# Patient Record
Sex: Female | Born: 1960 | Race: Black or African American | Hispanic: No | Smoking: Current every day smoker
Health system: Southern US, Community
[De-identification: ages and names within clinical notes are randomized; demographics above are authoritative.]

---

## 2006-11-30 DIAGNOSIS — M159 Polyosteoarthritis, unspecified: Secondary | ICD-10-CM | POA: Insufficient documentation

## 2007-03-16 ENCOUNTER — Ambulatory Visit: Payer: Self-pay | Admitting: Cardiology

## 2011-07-22 DIAGNOSIS — I10 Essential (primary) hypertension: Secondary | ICD-10-CM | POA: Insufficient documentation

## 2012-05-31 DIAGNOSIS — E042 Nontoxic multinodular goiter: Secondary | ICD-10-CM | POA: Insufficient documentation

## 2015-06-02 DIAGNOSIS — H2510 Age-related nuclear cataract, unspecified eye: Secondary | ICD-10-CM | POA: Insufficient documentation

## 2015-06-02 DIAGNOSIS — H02409 Unspecified ptosis of unspecified eyelid: Secondary | ICD-10-CM | POA: Insufficient documentation

## 2015-06-02 DIAGNOSIS — H47392 Other disorders of optic disc, left eye: Secondary | ICD-10-CM | POA: Insufficient documentation

## 2015-06-02 DIAGNOSIS — H401190 Primary open-angle glaucoma, unspecified eye, stage unspecified: Secondary | ICD-10-CM | POA: Insufficient documentation

## 2015-08-28 DIAGNOSIS — F1120 Opioid dependence, uncomplicated: Secondary | ICD-10-CM | POA: Insufficient documentation

## 2015-11-01 DIAGNOSIS — F32A Depression, unspecified: Secondary | ICD-10-CM | POA: Insufficient documentation

## 2015-11-01 DIAGNOSIS — F329 Major depressive disorder, single episode, unspecified: Secondary | ICD-10-CM | POA: Insufficient documentation

## 2016-12-06 ENCOUNTER — Encounter: Payer: Self-pay | Admitting: Endocrinology

## 2016-12-06 ENCOUNTER — Ambulatory Visit: Payer: Medicare HMO | Admitting: Endocrinology

## 2016-12-06 VITALS — BP 152/96 | HR 69 | Ht 62.75 in | Wt 106.8 lb

## 2016-12-06 DIAGNOSIS — M797 Fibromyalgia: Secondary | ICD-10-CM | POA: Insufficient documentation

## 2016-12-06 DIAGNOSIS — E042 Nontoxic multinodular goiter: Secondary | ICD-10-CM

## 2016-12-06 DIAGNOSIS — R7989 Other specified abnormal findings of blood chemistry: Secondary | ICD-10-CM | POA: Diagnosis not present

## 2016-12-06 LAB — T4, FREE: Free T4: 0.94 ng/dL (ref 0.60–1.60)

## 2016-12-06 LAB — TSH: TSH: 0.43 u[IU]/mL (ref 0.35–4.50)

## 2016-12-06 LAB — T3, FREE: T3, Free: 3.5 pg/mL (ref 2.3–4.2)

## 2016-12-06 NOTE — Progress Notes (Signed)
Patient ID: Alison Bond, female   DOB: 08/07/1960, 56 y.o.   MRN: [id number]          Reason for Appointment: Goiter, new consultation    History of Present Illness:   The patient's thyroid enlargement was first discovered in 2014 Most likely this was diagnosed on a routine physical exam but details not available  She has had no complaints of difficulty with swallowing  Does not feel like she has any choking sensation in her neck or pressure  Review of outside records and labs indicate that she has had a low TSH periodically since 2009 with the lowest level of 0.15 in 2015 Free T3 levels and free T4 levels have been consistently normal  At some point, possibly 2015 her PCP apparently treated her with methimazole for about a year but at that point she does not think she was having any symptoms of palpitations or unusual weight loss and she did not feel subjectively better with this  No results found for: FREET4, TSH  She has had an ultrasound exam in  2014 which showed multiple small thyroid nodules and diffuse heterogenous echogenicity, largest nodule 1.1 cm on the left, mixed solid and cystic, well circumscribed Follow-up ultrasound done in 2017 apparently showed no change, this was done by a general surgeon who had been consulted for her goiter.  Patient is being for now because of persistent low TSH levels with the last levels as follows:  09/02/16: TSH 0.13 with free T4 0.94, free T3 was last checked in 01/2015 and was 3.6  Currently the patient does not complain of any weight loss.  She says she has lost weight this year because of her cancer and weight has leveled off She has no complaints of palpitations, shakiness, heat intolerance or sweating   Allergies as of 12/06/2016      Reactions   Aspirin    Patient has ulcers and hurts her stomach and has a tight chest      Medication List        Accurate as of 12/06/16  4:59 PM. Always use your most recent med  list.          atenolol 50 MG tablet Commonly known as:  TENORMIN TAKE 2 TABLETS BY MOUTH EVERY DAY IN THE MORNING   Biotin 1000 MCG tablet Take 5,000 mcg by mouth daily.   brimonidine 0.2 % ophthalmic solution Commonly known as:  ALPHAGAN Apply to eye.   busPIRone 15 MG tablet Commonly known as:  BUSPAR TAKE 0.5-1 TABS BY MOUTH THREE TIMES DAILY AS NEEDED FOR OTHER (ANXIETY)   CENTRUM ADULTS PO Take 1 tablet by mouth daily.   cetirizine 10 MG tablet Commonly known as:  ZYRTEC Take by mouth.   diclofenac sodium 1 % Gel Commonly known as:  VOLTAREN Apply topically 2 (two) times daily.   DULoxetine 60 MG capsule Commonly known as:  CYMBALTA Take by mouth.   fluticasone 50 MCG/ACT nasal spray Commonly known as:  FLONASE Place into the nose.   losartan 50 MG tablet Commonly known as:  COZAAR TAKE 1 TABLET BY MOUTH EVERY DAY IN THE MORNING   LUMIGAN 0.01 % Soln Generic drug:  bimatoprost 1 Drop In rt eye. Dr Alferd Pateeichmond   meloxicam 15 MG tablet Commonly known as:  MOBIC TAKE 1 TAB BY MOUTH EVERY DAY TAKE WITH FOOD (FOR JOINT PAIN)   omeprazole 40 MG capsule Commonly known as:  PRILOSEC TAKE 1 CAPSULE BY MOUTH EVERY  EVENING BEFORE MEAL   oxyCODONE 15 MG immediate release tablet Commonly known as:  ROXICODONE Take 15 mg by mouth 4 (four) times daily.   SAVELLA 100 MG Tabs tablet Generic drug:  Milnacipran HCl Take 100 mg by mouth 2 (two) times daily.   vitamin B-12 100 MCG tablet Commonly known as:  CYANOCOBALAMIN Take one daily.       Allergies:  Allergies  Allergen Reactions  . Aspirin     Patient has ulcers and hurts her stomach and has a tight chest    History reviewed. No pertinent past medical history.  History reviewed. No pertinent surgical history.  Family History  Problem Relation Age of Onset  . Diabetes Mother   . Cancer Mother   . Hypertension Mother   . Cancer Paternal Aunt   . Thyroid disease Neg Hx     Social History:   reports that she has been smoking.  she has never used smokeless tobacco. Her alcohol and drug histories are not on file.    Review of Systems  Constitutional: Positive for reduced appetite. Negative for weight loss.  HENT: Negative for hoarseness and trouble swallowing.   Respiratory: Negative for cough and shortness of breath.   Cardiovascular: Positive for palpitations.       Rare  Gastrointestinal: Positive for diarrhea. Negative for abdominal pain.  Endocrine: Positive for fatigue.  Musculoskeletal: Positive for joint pain and muscle aches.  Skin:       Thin hair  Neurological: Negative for weakness and tremors.  Psychiatric/Behavioral: Positive for insomnia.      Examination:   BP (!) 152/96   Pulse 69   Ht 5' 2.75" (1.594 m)   Wt 106 lb 12.8 oz (48.4 kg)   LMP 12/07/2010   SpO2 98%   BMI 19.07 kg/m    General Appearance: pleasant, thinly built, not anxious         Eyes: No abnormal prominence or eyelid swelling. No lid lag           Neck: The thyroid is enlarged diffusely and most visible in the isthmus. Right lobe is about twice normal, has multiple small firm nodules, left lobe is nearly the same with multiple small firm nodules The isthmus is fleshy and prominent without nodularity There is no lymphadenopathy in the neck .     Cardiovascular: Normal  heart sounds, no murmur Respiratory:  Lungs clear Abdomen shows no hepatosplenomegaly or other mass Neurological: REFLEXES:  diffusely diminished and difficult to elicit Skin: no rash, pigmentation or lesions         Assessment/Plan:  Multinodular goiter, small and long-standing with largest nodule about 1 cm, evaluated as recently as 2017 and previously also in 2014 without any changes Her thyroid is autonomous with low TSH levels for several years and more persistently since about 2015 No history of hyperthyroidism with consistently normal free T4 and T3 levels and she has no clinical signs or symptoms of  hyperthyroidism Her weight loss is unrelated and recently stable  Recommendations: We will check her thyroid levels including free T3 level again today If her free T4 and free T3 are very normal she does not need any further evaluation except periodic follow-up However if she has a high free T3 level will proceed with nuclear thyroid scan and possibly I-131 treatment, this was explained    [facility] LtdKUMAR,Alison Bond 12/06/2016  Addendum: All labs including TSH are normal, she will be followed up in one year  Office Visit on 12/06/2016  Component Date Value Ref Range Status  . T3, Free 12/06/2016 3.5  2.3 - 4.2 pg/mL Final  . Free T4 12/06/2016 0.94  0.60 - 1.60 ng/dL Final   Comment: Specimens from patients who are undergoing biotin therapy and /or ingesting biotin supplements may contain high levels of biotin.  The higher biotin concentration in these specimens interferes with this Free T4 assay.  Specimens that contain high levels  of biotin may cause false high results for this Free T4 assay.  Please interpret results in light of the total clinical presentation of the patient.    Marland Kitchen TSH 12/06/2016 0.43  0.35 - 4.50 uIU/mL Final

## 2017-12-05 NOTE — Progress Notes (Deleted)
Patient ID: Alison Bond, female   DOB: 02/04/1960, 57 y.o.   MRN: [id number]          Reason for Appointment: Goiter, new consultation    History of Present Illness:   The patient's thyroid enlargement was first discovered in 2014 Most likely this was diagnosed on a routine physical exam but details not available  She has had no complaints of difficulty with swallowing  Does not feel like she has any choking sensation in her neck or pressure  Review of outside records and labs indicate that she has had a low TSH periodically since 2009 with the lowest level of 0.15 in 2015 Free T3 levels and free T4 levels have been consistently normal  At some point, possibly 2015 her PCP apparently treated her with methimazole for about a year but at that point she does not think she was having any symptoms of palpitations or unusual weight loss and she did not feel subjectively better with this  Lab Results  Component Value Date   FREET4 0.94 12/06/2016   TSH 0.43 12/06/2016    She has had an ultrasound exam in  2014 which showed multiple small thyroid nodules and diffuse heterogenous echogenicity, largest nodule 1.1 cm on the left, mixed solid and cystic, well circumscribed Follow-up ultrasound done in 2017 apparently showed no change, this was done by a general surgeon who had been consulted for her goiter.  Patient is being for now because of persistent low TSH levels with the last levels as follows:  09/02/16: TSH 0.13 with free T4 0.94, free T3 was last checked in 01/2015 and was 3.6  Currently the patient does not complain of any weight loss.  She says she has lost weight this year because of her cancer and weight has leveled off She has no complaints of palpitations, shakiness, heat intolerance or sweating   Allergies as of 12/06/2017      Reactions   Aspirin    Patient has ulcers and hurts her stomach and has a tight chest      Medication List        Accurate as  of 12/05/17  8:59 PM. Always use your most recent med list.          atenolol 50 MG tablet Commonly known as:  TENORMIN TAKE 2 TABLETS BY MOUTH EVERY DAY IN THE MORNING   Biotin 1000 MCG tablet Take 5,000 mcg by mouth daily.   brimonidine 0.2 % ophthalmic solution Commonly known as:  ALPHAGAN Apply to eye.   busPIRone 15 MG tablet Commonly known as:  BUSPAR TAKE 0.5-1 TABS BY MOUTH THREE TIMES DAILY AS NEEDED FOR OTHER (ANXIETY)   CENTRUM ADULTS PO Take 1 tablet by mouth daily.   cetirizine 10 MG tablet Commonly known as:  ZYRTEC Take by mouth.   diclofenac sodium 1 % Gel Commonly known as:  VOLTAREN Apply topically 2 (two) times daily.   DULoxetine 60 MG capsule Commonly known as:  CYMBALTA Take by mouth.   fluticasone 50 MCG/ACT nasal spray Commonly known as:  FLONASE Place into the nose.   losartan 50 MG tablet Commonly known as:  COZAAR TAKE 1 TABLET BY MOUTH EVERY DAY IN THE MORNING   LUMIGAN 0.01 % Soln Generic drug:  bimatoprost 1 Drop In rt eye. Dr Alferd Pateeichmond   meloxicam 15 MG tablet Commonly known as:  MOBIC TAKE 1 TAB BY MOUTH EVERY DAY TAKE WITH FOOD (FOR JOINT PAIN)   omeprazole 40 MG  capsule Commonly known as:  PRILOSEC TAKE 1 CAPSULE BY MOUTH EVERY EVENING BEFORE MEAL   oxyCODONE 15 MG immediate release tablet Commonly known as:  ROXICODONE Take 15 mg by mouth 4 (four) times daily.   SAVELLA 100 MG Tabs tablet Generic drug:  Milnacipran HCl Take 100 mg by mouth 2 (two) times daily.   vitamin B-12 100 MCG tablet Commonly known as:  CYANOCOBALAMIN Take one daily.       Allergies:  Allergies  Allergen Reactions  . Aspirin     Patient has ulcers and hurts her stomach and has a tight chest    No past medical history on file.  No past surgical history on file.  Family History  Problem Relation Age of Onset  . Diabetes Mother   . Cancer Mother   . Hypertension Mother   . Cancer Paternal Aunt   . Thyroid disease Neg Hx      Social History:  reports that she has been smoking. She has never used smokeless tobacco. Her alcohol and drug histories are not on file.    Review of Systems    Examination:   There were no vitals taken for this visit.   General Appearance: pleasant, thinly built, not anxious         Eyes: No abnormal prominence or eyelid swelling. No lid lag           Neck: The thyroid is enlarged diffusely and most visible in the isthmus. Right lobe is about twice normal, has multiple small firm nodules, left lobe is nearly the same with multiple small firm nodules The isthmus is fleshy and prominent without nodularity There is no lymphadenopathy in the neck .     Cardiovascular: Normal  heart sounds, no murmur Respiratory:  Lungs clear Abdomen shows no hepatosplenomegaly or other mass Neurological: REFLEXES:  diffusely diminished and difficult to elicit Skin: no rash, pigmentation or lesions         Assessment/Plan:  Multinodular goiter, small and long-standing with largest nodule about 1 cm, evaluated as recently as 2017 and previously also in 2014 without any changes Her thyroid is autonomous with low TSH levels for several years and more persistently since about 2015 No history of hyperthyroidism with consistently normal free T4 and T3 levels and she has no clinical signs or symptoms of hyperthyroidism Her weight loss is unrelated and recently stable  Recommendations: We will check her thyroid levels including free T3 level again today If her free T4 and free T3 are very normal she does not need any further evaluation except periodic follow-up However if she has a high free T3 level will proceed with nuclear thyroid scan and possibly I-131 treatment, this was explained    Reather Littler 12/05/2017  Addendum: All labs including TSH are normal, she will be followed up in one year  No visits with results within 1 Week(s) from this visit.  Latest known visit with results is:  Office  Visit on 12/06/2016  Component Date Value Ref Range Status  . T3, Free 12/06/2016 3.5  2.3 - 4.2 pg/mL Final  . Free T4 12/06/2016 0.94  0.60 - 1.60 ng/dL Final   Comment: Specimens from patients who are undergoing biotin therapy and /or ingesting biotin supplements may contain high levels of biotin.  The higher biotin concentration in these specimens interferes with this Free T4 assay.  Specimens that contain high levels  of biotin may cause false high results for this Free T4 assay.  Please  interpret results in light of the total clinical presentation of the patient.    Marland Kitchen TSH 12/06/2016 0.43  0.35 - 4.50 uIU/mL Final

## 2017-12-06 ENCOUNTER — Ambulatory Visit: Payer: Medicare HMO | Admitting: Endocrinology

## 2017-12-06 DIAGNOSIS — Z0289 Encounter for other administrative examinations: Secondary | ICD-10-CM

## 2018-11-05 DIAGNOSIS — Z902 Acquired absence of lung [part of]: Secondary | ICD-10-CM | POA: Insufficient documentation

## 2019-02-15 DIAGNOSIS — I739 Peripheral vascular disease, unspecified: Secondary | ICD-10-CM | POA: Insufficient documentation

## 2019-06-10 NOTE — Progress Notes (Signed)
Patient ID: Alison Bond, female   DOB: 01-02-1961, 59 y.o.   MRN: [id number]          Reason for Appointment: Goiter, follow-up consultation    History of Present Illness:   The patient's thyroid enlargement was first discovered in 2014 Most likely this was diagnosed on a routine physical exam but details not available Review of outside records and labs indicate that she had a low TSH periodically since 2009 with the lowest level of 0.15 in 2015 Free T3 levels and free T4 levels have been consistently normal However in 2015 her PCP apparently treated her with methimazole for about a year but at that point she does not think she was having any symptoms of palpitations or unusual weight loss and she did not feel subjectively better with this  RECENT HISTORY: 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 without any need for treatment she was told to follow-up in 1 year but she is now coming back finally for follow-up  Currently the patient complains of increasing fatigue for the last 2 to 3 years and especially in the last month Not having any recent weight loss although previously had lost weight with her cancer history Also has no symptoms of palpitations, shakiness, heat intolerance or sweating  She has had no complaints of difficulty with swallowing  Does not feel like she has any choking sensation in her neck or pressure  Last TSH done in 10/2018 was 0.44 Free thyroxine index normal at 1.04 but no free T3 available since 2017 when it was normal  Lab Results  Component Value Date   FREET4 0.94 12/06/2016   TSH 0.43 12/06/2016    She has had an ultrasound exam in  2014 which showed multiple small thyroid nodules and diffuse heterogenous echogenicity, largest nodule 1.1 cm on the left, mixed solid and cystic, well circumscribed Follow-up ultrasound done in 2017 apparently showed no change, this was done by a general surgeon who had been consulted for her  goiter.     Allergies as of 06/11/2019      Reactions   Aspirin    Patient has ulcers and hurts her stomach and has a tight chest      Medication List       Accurate as of Jun 11, 2019  2:08 PM. If you have any questions, ask your nurse or doctor.        STOP taking these medications   cetirizine 10 MG tablet Commonly known as: ZYRTEC Stopped by: Elayne Snare, MD   Savella 100 MG Tabs tablet Generic drug: Milnacipran HCl Stopped by: Elayne Snare, MD     TAKE these medications   atenolol 50 MG tablet Commonly known as: TENORMIN TAKE 2 TABLETS BY MOUTH EVERY DAY IN THE MORNING   Biotin 1000 MCG tablet Take 5,000 mcg by mouth daily.   brimonidine 0.2 % ophthalmic solution Commonly known as: ALPHAGAN Apply to eye.   busPIRone 15 MG tablet Commonly known as: BUSPAR TAKE 0.5-1 TABS BY MOUTH THREE TIMES DAILY AS NEEDED FOR OTHER (ANXIETY)   CENTRUM ADULTS PO Take 1 tablet by mouth daily.   diclofenac sodium 1 % Gel Commonly known as: VOLTAREN Apply topically 2 (two) times daily.   DULoxetine 60 MG capsule Commonly known as: CYMBALTA Take by mouth.   fluticasone 50 MCG/ACT nasal spray Commonly known as: FLONASE Place into the nose.   losartan 50 MG tablet Commonly known as: COZAAR TAKE 1  TABLET BY MOUTH EVERY DAY IN THE MORNING   Lumigan 0.01 % Soln Generic drug: bimatoprost 1 Drop In rt eye. Dr Alferd Patee   meloxicam 15 MG tablet Commonly known as: MOBIC TAKE 1 TAB BY MOUTH EVERY DAY TAKE WITH FOOD (FOR JOINT PAIN)   mirtazapine 7.5 MG tablet Commonly known as: REMERON Take 7.5 mg by mouth at bedtime.   omeprazole 40 MG capsule Commonly known as: PRILOSEC TAKE 1 CAPSULE BY MOUTH EVERY EVENING BEFORE MEAL   oxyCODONE 15 MG immediate release tablet Commonly known as: ROXICODONE Take 15 mg by mouth 4 (four) times daily.   vitamin B-12 100 MCG tablet Commonly known as: CYANOCOBALAMIN Take one daily.       Allergies:  Allergies  Allergen  Reactions  . Aspirin     Patient has ulcers and hurts her stomach and has a tight chest    History reviewed. No pertinent past medical history.  History reviewed. No pertinent surgical history.  Family History  Problem Relation Age of Onset  . Diabetes Mother   . Cancer Mother   . Hypertension Mother   . Cancer Paternal Aunt   . Thyroid disease Neg Hx     Social History:  reports that she has been smoking. She has never used smokeless tobacco. No history on file for alcohol and drug.    Review of Systems  Constitutional: Positive for reduced appetite. Negative for weight loss and weight gain.  HENT: Negative for hoarseness and trouble swallowing.   Respiratory: Negative for shortness of breath.   Cardiovascular: Positive for palpitations. Negative for leg swelling.  Gastrointestinal:       Genella Rife  Endocrine: Positive for fatigue.       For 2-3 years, worse now  Musculoskeletal: Positive for muscle aches.  Neurological: Positive for weakness.  Psychiatric/Behavioral: Positive for depressed mood, nervousness and insomnia.      Examination:   BP 120/62 (BP Location: Left Arm, Patient Position: Sitting, Cuff Size: Normal)   Pulse 66   Ht 5' 2.75" (1.594 m)   Wt 106 lb 12.8 oz (48.4 kg)   SpO2 99%   BMI 19.07 kg/m   She is mildly asthenic looking, not anxious, pleasant and cooperative         Eyes: No prominence or eyelid swelling  Thyroid exam: Right lobe is about 2-2-1/2 times normal, slightly firm with multiple small firm nodules, left lobe slightly smaller with multiple small nodules also The isthmus more uniform with a nodule on the right lateral area There is no lymphadenopathy in the neck .     Cardiovascular: Normal  heart sounds, no murmur Respiratory:  Lungs clear Abdomen shows no hepatosplenomegaly or other mass  Neurological: REFLEXES show normal contraction and relaxation  No ankle edema or skin rash        Assessment/Plan:  Multinodular  goiter, small and long-standing with largest nodule 1.1 cm, evaluated last in 2017 and previously also in 2014 with stable findings  She has autonomous function of the thyroid with low TSH levels for several years and more persistently since about 2015  Currently has no clinical signs or symptoms of hyperthyroidism Pulses relatively slow because of beta-blocker used for hypertension  Recommendations: We will check her thyroid levels including free T3 level again today If her free T4 and free T3 are normal she does not need any further evaluation again  However if she has a high free T3 level will proceed with nuclear thyroid scan and possibly I-131  treatment  Also since her thyroid exam today is quite similar to her previous findings in 2018 do not think repeat ultrasound is necessary; no new nodules felt  FATIGUE: This is unlikely to be from thyroid dysfunction and she will discuss with PCP, having more issues with insomnia and recent stress    Reather Littler 06/11/2019  Addendum Labs as follows: Since she has high T3 level will need to do thyroid scan and uptake to consider I-131 treatment   Office Visit on 06/11/2019  Component Date Value Ref Range Status  . T3, Free 06/11/2019 4.3* 2.3 - 4.2 pg/mL Final  . Free T4 06/11/2019 1.33  0.60 - 1.60 ng/dL Final   Comment: Specimens from patients who are undergoing biotin therapy and /or ingesting biotin supplements may contain high levels of biotin.  The higher biotin concentration in these specimens interferes with this Free T4 assay.  Specimens that contain high levels  of biotin may cause false high results for this Free T4 assay.  Please interpret results in light of the total clinical presentation of the patient.    Marland Kitchen TSH 06/11/2019 0.22* 0.35 - 4.50 uIU/mL Final

## 2019-06-11 ENCOUNTER — Ambulatory Visit (INDEPENDENT_AMBULATORY_CARE_PROVIDER_SITE_OTHER): Payer: Medicare HMO | Admitting: Endocrinology

## 2019-06-11 ENCOUNTER — Other Ambulatory Visit: Payer: Self-pay

## 2019-06-11 ENCOUNTER — Encounter: Payer: Self-pay | Admitting: Endocrinology

## 2019-06-11 VITALS — BP 120/62 | HR 66 | Ht 62.75 in | Wt 106.8 lb

## 2019-06-11 DIAGNOSIS — R5382 Chronic fatigue, unspecified: Secondary | ICD-10-CM | POA: Diagnosis not present

## 2019-06-11 DIAGNOSIS — E0520 Thyrotoxicosis with toxic multinodular goiter without thyrotoxic crisis or storm: Secondary | ICD-10-CM | POA: Diagnosis not present

## 2019-06-11 DIAGNOSIS — E042 Nontoxic multinodular goiter: Secondary | ICD-10-CM

## 2019-06-11 DIAGNOSIS — E0590 Thyrotoxicosis, unspecified without thyrotoxic crisis or storm: Secondary | ICD-10-CM | POA: Diagnosis not present

## 2019-06-11 LAB — T3, FREE: T3, Free: 4.3 pg/mL — ABNORMAL HIGH (ref 2.3–4.2)

## 2019-06-11 LAB — T4, FREE: Free T4: 1.33 ng/dL (ref 0.60–1.60)

## 2019-06-11 LAB — TSH: TSH: 0.22 u[IU]/mL — ABNORMAL LOW (ref 0.35–4.50)

## 2019-06-12 NOTE — Progress Notes (Signed)
Her labs show slight overactivity of the thyroid.  Need to do nuclear thyroid scan and if she has overactive areas in the thyroid will need treatment with radioactive iodine.  Can discuss with her further if needed on telephone.

## 2019-06-25 ENCOUNTER — Other Ambulatory Visit: Payer: Self-pay

## 2019-06-25 ENCOUNTER — Encounter (HOSPITAL_COMMUNITY)
Admission: RE | Admit: 2019-06-25 | Discharge: 2019-06-25 | Disposition: A | Discharge status: Home / Self Care | Payer: Medicare HMO | Source: Ambulatory Visit | Attending: Endocrinology | Admitting: Endocrinology

## 2019-06-25 ENCOUNTER — Encounter (HOSPITAL_BASED_OUTPATIENT_CLINIC_OR_DEPARTMENT_OTHER)
Admission: RE | Admit: 2019-06-25 | Discharge: 2019-06-25 | Disposition: A | Discharge status: Home / Self Care | Payer: Medicare HMO | Source: Ambulatory Visit | Attending: Endocrinology | Admitting: Endocrinology

## 2019-06-25 DIAGNOSIS — E0590 Thyrotoxicosis, unspecified without thyrotoxic crisis or storm: Secondary | ICD-10-CM | POA: Insufficient documentation

## 2019-06-25 MED ORDER — SODIUM IODIDE I-123 7.4 MBQ CAPS
442.0000 | ORAL_CAPSULE | Freq: Once | ORAL | Status: AC
  Administered 2019-06-25: 442 via ORAL

## 2019-06-26 ENCOUNTER — Encounter (HOSPITAL_COMMUNITY)
Admission: RE | Admit: 2019-06-26 | Discharge: 2019-06-26 | Disposition: A | Discharge status: Home / Self Care | Payer: Medicare HMO | Source: Ambulatory Visit | Attending: Endocrinology | Admitting: Endocrinology

## 2019-07-01 ENCOUNTER — Other Ambulatory Visit: Payer: Self-pay

## 2019-07-01 MED ORDER — PROPYLTHIOURACIL 50 MG PO TABS: 50.0000 mg | ORAL_TABLET | Freq: Two times a day (BID) | ORAL | 1 refills | Status: DC

## 2019-07-02 ENCOUNTER — Other Ambulatory Visit: Payer: Self-pay

## 2019-07-02 DIAGNOSIS — E0590 Thyrotoxicosis, unspecified without thyrotoxic crisis or storm: Secondary | ICD-10-CM

## 2019-07-02 DIAGNOSIS — E0520 Thyrotoxicosis with toxic multinodular goiter without thyrotoxic crisis or storm: Secondary | ICD-10-CM

## 2019-07-02 NOTE — Addendum Note (Signed)
Addended by: Adline Mango I on: 07/02/2019 09:20 AM   Modules accepted: Orders

## 2019-07-10 ENCOUNTER — Telehealth: Payer: Self-pay

## 2019-07-10 NOTE — Telephone Encounter (Signed)
FAXED DOCUMENTS  Company: Dr[facility] Ver[facility] office (faxed on 07/04/19)  Document: Orders for pt to have labs at that office Other records requested: none at this time.  All above requested information has been faxed successfully to [employer] listed above. Documents and fax confirmation have been placed in the faxed file for future reference.

## 2019-07-23 LAB — T3, FREE: T3, Free: 3.3 pg/mL (ref 2.3–4.2)

## 2019-07-23 LAB — T4: T4, Total: 7.5 ug/dL (ref 5.1–11.9)

## 2019-07-23 LAB — TSH: TSH: 0.43 mIU/L (ref 0.40–4.50)

## 2019-07-25 ENCOUNTER — Telehealth: Payer: Self-pay | Admitting: Endocrinology

## 2019-07-25 NOTE — Telephone Encounter (Signed)
Patient called stating since she started taking propylthiouracil she has been experiencing symptoms of exhaustion, loss of appetite, sweats, hands and legs hurting, and feet hurt when she wakes up in the morning, she is wondering if this medication is a cause of these symptoms. Please advise, ph# [phone]

## 2019-07-25 NOTE — Telephone Encounter (Signed)
Please advise 

## 2019-07-25 NOTE — Telephone Encounter (Signed)
Very unlikely that the low-dose of medication will cause the symptoms.  She needs to see her PCP first to evaluate the symptoms

## 2019-07-26 NOTE — Telephone Encounter (Signed)
Called pt and gave her MD message. Pt verbalized understanding. 

## 2019-08-11 NOTE — Progress Notes (Deleted)
Patient ID: Alison Bond, female   DOB: 1960/09/15, 59 y.o.   MRN: [id number]          Reason for Appointment: Goiter, follow-up consultation    History of Present Illness:   The patient's thyroid enlargement was first discovered in 2014 Most likely this was diagnosed on a routine physical exam but details not available Review of outside records and labs indicate that she had a low TSH periodically since 2009 with the lowest level of 0.15 in 2015 Free T3 levels and free T4 levels have been consistently normal However in 2015 her PCP apparently treated her with methimazole for about a year but at that point she does not think she was having any symptoms of palpitations or unusual weight loss and she did not feel subjectively better with this  RECENT HISTORY: 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 without any need for treatment she was told to follow-up in 1 year but she is now coming back finally for follow-up  Currently the patient complains of increasing fatigue for the last 2 to 3 years and especially in the last month Not having any recent weight loss although previously had lost weight with her cancer history Also has no symptoms of palpitations, shakiness, heat intolerance or sweating  She has had no complaints of difficulty with swallowing  Does not feel like she has any choking sensation in her neck or pressure  Last TSH done in 10/2018 was 0.44 Free thyroxine index normal at 1.04 but no free T3 available since 2017 when it was normal  Lab Results  Component Value Date   FREET4 1.33 06/11/2019   FREET4 0.94 12/06/2016   TSH 0.43 07/22/2019   TSH 0.22 (L) 06/11/2019   TSH 0.43 12/06/2016    She has had an ultrasound exam in  2014 which showed multiple small thyroid nodules and diffuse heterogenous echogenicity, largest nodule 1.1 cm on the left, mixed solid and cystic, well circumscribed Follow-up ultrasound done in 2017 apparently showed no  change, this was done by a general surgeon who had been consulted for her goiter.     Allergies as of 08/12/2019      Reactions   Aspirin    Patient has ulcers and hurts her stomach and has a tight chest      Medication List       Accurate as of August 11, 2019  9:00 PM. If you have any questions, ask your nurse or doctor.        atenolol 50 MG tablet Commonly known as: TENORMIN TAKE 2 TABLETS BY MOUTH EVERY DAY IN THE MORNING   Biotin 1000 MCG tablet Take 5,000 mcg by mouth daily.   brimonidine 0.2 % ophthalmic solution Commonly known as: ALPHAGAN Apply to eye.   busPIRone 15 MG tablet Commonly known as: BUSPAR TAKE 0.5-1 TABS BY MOUTH THREE TIMES DAILY AS NEEDED FOR OTHER (ANXIETY)   CENTRUM ADULTS PO Take 1 tablet by mouth daily.   diclofenac sodium 1 % Gel Commonly known as: VOLTAREN Apply topically 2 (two) times daily.   DULoxetine 60 MG capsule Commonly known as: CYMBALTA Take by mouth.   fluticasone 50 MCG/ACT nasal spray Commonly known as: FLONASE Place into the nose.   losartan 50 MG tablet Commonly known as: COZAAR TAKE 1 TABLET BY MOUTH EVERY DAY IN THE MORNING   Lumigan 0.01 % Soln Generic drug: bimatoprost 1 Drop In rt eye. Dr Alferd Patee   meloxicam 15  MG tablet Commonly known as: MOBIC TAKE 1 TAB BY MOUTH EVERY DAY TAKE WITH FOOD (FOR JOINT PAIN)   mirtazapine 7.5 MG tablet Commonly known as: REMERON Take 7.5 mg by mouth at bedtime.   omeprazole 40 MG capsule Commonly known as: PRILOSEC TAKE 1 CAPSULE BY MOUTH EVERY EVENING BEFORE MEAL   oxyCODONE 15 MG immediate release tablet Commonly known as: ROXICODONE Take 15 mg by mouth 4 (four) times daily.   propylthiouracil 50 MG tablet Commonly known as: PTU Take 1 tablet (50 mg total) by mouth 2 (two) times daily.   vitamin B-12 100 MCG tablet Commonly known as: CYANOCOBALAMIN Take one daily.       Allergies:  Allergies  Allergen Reactions  . Aspirin     Patient has ulcers and  hurts her stomach and has a tight chest    No past medical history on file.  No past surgical history on file.  Family History  Problem Relation Age of Onset  . Diabetes Mother   . Cancer Mother   . Hypertension Mother   . Cancer Paternal Aunt   . Thyroid disease Neg Hx     Social History:  reports that she has been smoking. She has never used smokeless tobacco. No history on file for alcohol use and drug use.    Review of Systems    Examination:   There were no vitals taken for this visit.  She is mildly asthenic looking, not anxious, pleasant and cooperative         Eyes: No prominence or eyelid swelling  Thyroid exam: Right lobe is about 2-2-1/2 times normal, slightly firm with multiple small firm nodules, left lobe slightly smaller with multiple small nodules also The isthmus more uniform with a nodule on the right lateral area There is no lymphadenopathy in the neck .     Cardiovascular: Normal  heart sounds, no murmur Respiratory:  Lungs clear Abdomen shows no hepatosplenomegaly or other mass  Neurological: REFLEXES show normal contraction and relaxation  No ankle edema or skin rash        Assessment/Plan:  Multinodular goiter, small and long-standing with largest nodule 1.1 cm, evaluated last in 2017 and previously also in 2014 with stable findings  She has autonomous function of the thyroid with low TSH levels for several years and more persistently since about 2015  Currently has no clinical signs or symptoms of hyperthyroidism Pulses relatively slow because of beta-blocker used for hypertension  Recommendations: We will check her thyroid levels including free T3 level again today If her free T4 and free T3 are normal she does not need any further evaluation again  However if she has a high free T3 level will proceed with nuclear thyroid scan and possibly I-131 treatment  Also since her thyroid exam today is quite similar to her previous findings in  2018 do not think repeat ultrasound is necessary; no new nodules felt  FATIGUE: This is unlikely to be from thyroid dysfunction and she will discuss with PCP, having more issues with insomnia and recent stress    Reather Littler 08/11/2019  Addendum Labs as follows: Since she has high T3 level will need to do thyroid scan and uptake to consider I-131 treatment   No visits with results within 1 Week(s) from this visit.  Latest known visit with results is:  Orders Only on 07/02/2019  Component Date Value Ref Range Status  . T3, Free 07/22/2019 3.3  2.3 - 4.2 pg/mL Final  .  T4, Total 07/22/2019 7.5  5.1 - 11.9 mcg/dL Final  . TSH [birth year]1950 0.43  0.40 - 4.50 mIU/L Final

## 2019-08-12 ENCOUNTER — Ambulatory Visit: Payer: Medicare HMO | Admitting: Endocrinology

## 2019-08-26 ENCOUNTER — Other Ambulatory Visit: Payer: Self-pay

## 2019-08-26 ENCOUNTER — Ambulatory Visit (INDEPENDENT_AMBULATORY_CARE_PROVIDER_SITE_OTHER): Payer: Medicare HMO | Admitting: Endocrinology

## 2019-08-26 ENCOUNTER — Encounter: Payer: Self-pay | Admitting: Endocrinology

## 2019-08-26 VITALS — BP 120/68 | HR 63 | Ht 62.75 in | Wt 103.2 lb

## 2019-08-26 DIAGNOSIS — E0590 Thyrotoxicosis, unspecified without thyrotoxic crisis or storm: Secondary | ICD-10-CM

## 2019-08-26 DIAGNOSIS — E042 Nontoxic multinodular goiter: Secondary | ICD-10-CM | POA: Diagnosis not present

## 2019-08-26 DIAGNOSIS — R5382 Chronic fatigue, unspecified: Secondary | ICD-10-CM | POA: Diagnosis not present

## 2019-08-26 LAB — CBC
HCT: 36.7 % (ref 36.0–46.0)
Hemoglobin: 12.7 g/dL (ref 12.0–15.0)
MCHC: 34.7 g/dL (ref 30.0–36.0)
MCV: 97.7 fl (ref 78.0–100.0)
Platelets: 172 10*3/uL (ref 150.0–400.0)
RBC: 3.75 Mil/uL — ABNORMAL LOW (ref 3.87–5.11)
RDW: 12.3 % (ref 11.5–15.5)
WBC: 4.1 10*3/uL (ref 4.0–10.5)

## 2019-08-26 LAB — T4, FREE: Free T4: 0.97 ng/dL (ref 0.60–1.60)

## 2019-08-26 LAB — TSH: TSH: 0.41 u[IU]/mL (ref 0.35–4.50)

## 2019-08-26 LAB — T3, FREE: T3, Free: 3.4 pg/mL (ref 2.3–4.2)

## 2019-08-26 NOTE — Progress Notes (Addendum)
Patient ID: Alison Bond, female   DOB: [birth year], 59 y.o.   MRN: [id number]          Reason for Appointment: Goiter, follow-up consultation    History of Present Illness:   The patient's thyroid enlargement was first discovered in 2014 Most likely this was diagnosed on a routine physical exam but details not available Review of outside records and labs indicate that she had a low TSH periodically since 2009 with the lowest level of 0.15 in 2015 Free T3 levels and free T4 levels have been consistently normal However in 2015 her PCP apparently treated her with methimazole for about a year but at that point she does not think she was having any symptoms of palpitations or unusual weight loss and she did not feel subjectively better with this 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 without any need for treatment she was told to follow-up in 1 year   RECENT HISTORY: On her follow-up in 5/21 she was 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 She was complaining of increasing fatigue for the last 2 to 3 years and especially in the prior month However was not having any typical symptoms of hyperthyroidism including recent weight loss although previously had lost weight with her cancer history Also had no symptoms of palpitations, shakiness, heat intolerance or sweating  Thyroid scan did not show any abnormality and since uptake was normal she is now on a trial of PTU 50 mg twice daily She does not feel any different with this Her weight has gone down further She is not feeling any improvement in her fatigue  However thyroid levels are back to normal including T3 and TSH  TSH done in 10/2018 was 0.44 Free thyroxine index normal at 1.04  Lab Results  Component Value Date   FREET4 1.33 06/11/2019   FREET4 0.94 12/06/2016   TSH 0.43 07/22/2019   TSH 0.22 (L) 06/11/2019   TSH 0.43 12/06/2016   Lab Results  Component Value Date   T3FREE 3.3  07/22/2019   T3FREE 4.3 (H) 06/11/2019   T3FREE 3.5 12/06/2016    She has had an ultrasound exam in  2014 which showed multiple small thyroid nodules and diffuse heterogenous echogenicity, largest nodule 1.1 cm on the left, mixed solid and cystic, well circumscribed Follow-up ultrasound done in 2017 apparently showed no change, this was done by a general surgeon who had been consulted for her goiter.     Allergies as of 08/26/2019      Reactions   Aspirin    Patient has ulcers and hurts her stomach and has a tight chest      Medication List       Accurate as of August 26, 2019  1:18 PM. If you have any questions, ask your nurse or doctor.        atenolol 50 MG tablet Commonly known as: TENORMIN TAKE 2 TABLETS BY MOUTH EVERY DAY IN THE MORNING   Biotin 1000 MCG tablet Take 5,000 mcg by mouth daily.   brimonidine 0.2 % ophthalmic solution Commonly known as: ALPHAGAN Apply to eye.   busPIRone 15 MG tablet Commonly known as: BUSPAR TAKE 0.5-1 TABS BY MOUTH THREE TIMES DAILY AS NEEDED FOR OTHER (ANXIETY)   CENTRUM ADULTS PO Take 1 tablet by mouth daily.   diclofenac sodium 1 % Gel Commonly known as: VOLTAREN Apply topically 2 (two) times daily.   DULoxetine 60 MG capsule Commonly known as:  CYMBALTA Take by mouth.   fluticasone 50 MCG/ACT nasal spray Commonly known as: FLONASE Place into the nose.   losartan 50 MG tablet Commonly known as: COZAAR TAKE 1 TABLET BY MOUTH EVERY DAY IN THE MORNING   Lumigan 0.01 % Soln Generic drug: bimatoprost 1 Drop In rt eye. Dr Alferd Patee   meloxicam 15 MG tablet Commonly known as: MOBIC TAKE 1 TAB BY MOUTH EVERY DAY TAKE WITH FOOD (FOR JOINT PAIN)   mirtazapine 7.5 MG tablet Commonly known as: REMERON Take 7.5 mg by mouth at bedtime.   omeprazole 40 MG capsule Commonly known as: PRILOSEC TAKE 1 CAPSULE BY MOUTH EVERY EVENING BEFORE MEAL   oxyCODONE 15 MG immediate release tablet Commonly known as: ROXICODONE Take 15  mg by mouth 4 (four) times daily.   propylthiouracil 50 MG tablet Commonly known as: PTU Take 1 tablet (50 mg total) by mouth 2 (two) times daily.   vitamin B-12 100 MCG tablet Commonly known as: CYANOCOBALAMIN Take one daily.       Allergies:  Allergies  Allergen Reactions  . Aspirin     Patient has ulcers and hurts her stomach and has a tight chest    No past medical history on file.  No past surgical history on file.  Family History  Problem Relation Age of Onset  . Diabetes Mother   . Cancer Mother   . Hypertension Mother   . Cancer Paternal Aunt   . Thyroid disease Neg Hx     Social History:  reports that she has been smoking. She has never used smokeless tobacco. No history on file for alcohol use and drug use.    Review of Systems  Constitutional: Positive for weight loss and reduced appetite.  Cardiovascular: Negative for palpitations.  Endocrine: Positive for fatigue.  Musculoskeletal: Positive for joint pain.  Psychiatric/Behavioral: Positive for depressed mood and insomnia.   Wt Readings from Last 3 Encounters:  06/11/19 106 lb 12.8 oz (48.4 kg)  12/06/16 106 lb 12.8 oz (48.4 kg)      Examination:   BP 120/68 (BP Location: Left Arm, Patient Position: Sitting, Cuff Size: Normal)   Pulse 63   Ht 5' 2.75" (1.594 m)   SpO2 97%   BMI 19.07 kg/m    Thyroid exam: Right lobe is about 2-1/2 times normal, slightly firm but relatively smooth and minimal nodularity Left lobe is about twice normal and relatively smooth Slightly firm enlargement of the isthmus present  Biceps reflexes are normal No tremor     Assessment/Plan:  Multinodular goiter, small and long-standing with largest nodule 1.1 cm, evaluated last in 2017 and previously also in 2014 with stable findings  She has autonomous function of the thyroid with low TSH levels for several years However thyroid scan did not show any hot or warm areas and normal uptake Also no cold areas  seen  Because of subclinical hyperthyroidism she is now on PTU since June but her labs were done over a month ago  Recommendations: We will check her thyroid levels again including free T3 level Also check thyrotropin receptor antibody to rule out Graves' disease Follow-up in about 2 months  FATIGUE: This is unlikely to be from thyroid dysfunction and she will discuss with PCP Discussed that she can try Remeron even though she does not feel depressed since she is having difficulty with sleep and weight loss    Reather Littler 08/26/2019  Addendum Labs normal: Stay on same Rx

## 2019-08-27 ENCOUNTER — Encounter: Payer: Self-pay | Admitting: Endocrinology

## 2019-08-27 LAB — THYROTROPIN RECEPTOR AUTOABS: Thyrotropin Receptor Ab: <1.1 IU/L (ref 0.00–1.75)

## 2019-08-29 ENCOUNTER — Telehealth: Payer: Self-pay | Admitting: *Deleted

## 2019-09-02 NOTE — Progress Notes (Signed)
Please call to let patient know that the lab results are normal and no change needed

## 2019-10-14 ENCOUNTER — Other Ambulatory Visit: Payer: Self-pay | Admitting: Endocrinology

## 2019-10-14 DIAGNOSIS — Z85118 Personal history of other malignant neoplasm of bronchus and lung: Secondary | ICD-10-CM | POA: Insufficient documentation

## 2019-10-14 MED ORDER — PROPYLTHIOURACIL 50 MG PO TABS: 50.0000 mg | ORAL_TABLET | Freq: Two times a day (BID) | ORAL | 1 refills | Status: DC

## 2019-10-14 NOTE — Addendum Note (Signed)
Addended by: Judd Gaudier on: 10/14/2019 11:24 AM   Modules accepted: Orders

## 2019-10-18 ENCOUNTER — Telehealth: Payer: Self-pay

## 2019-10-18 NOTE — Telephone Encounter (Signed)
New message    The patient is scheduled for labs on 10.6.21 in [place] is asking for a fax request what type of labs that needs to be drawn   Fax # [phone]  Phone # [phone]  Attention: Dr. Chestine Spore

## 2019-10-21 ENCOUNTER — Other Ambulatory Visit: Payer: Self-pay | Admitting: Endocrinology

## 2019-10-21 DIAGNOSIS — E0520 Thyrotoxicosis with toxic multinodular goiter without thyrotoxic crisis or storm: Secondary | ICD-10-CM

## 2019-10-21 NOTE — Telephone Encounter (Signed)
Labs have been ordered, please fax

## 2019-10-21 NOTE — Telephone Encounter (Signed)
Lab orders faxed.

## 2019-10-23 ENCOUNTER — Encounter: Payer: Self-pay | Admitting: Endocrinology

## 2019-10-23 ENCOUNTER — Other Ambulatory Visit: Payer: Self-pay

## 2019-10-23 ENCOUNTER — Ambulatory Visit: Payer: Medicare HMO | Admitting: Endocrinology

## 2019-10-23 ENCOUNTER — Ambulatory Visit (INDEPENDENT_AMBULATORY_CARE_PROVIDER_SITE_OTHER): Payer: Medicare HMO | Admitting: Endocrinology

## 2019-10-23 VITALS — BP 116/62 | HR 69 | Ht 63.0 in | Wt 106.0 lb

## 2019-10-23 DIAGNOSIS — E0520 Thyrotoxicosis with toxic multinodular goiter without thyrotoxic crisis or storm: Secondary | ICD-10-CM | POA: Diagnosis not present

## 2019-10-23 LAB — TSH: TSH: 0.53 u[IU]/mL (ref 0.35–4.50)

## 2019-10-23 LAB — T4, FREE: Free T4: 0.82 ng/dL (ref 0.60–1.60)

## 2019-10-23 LAB — T3, FREE: T3, Free: 3.4 pg/mL (ref 2.3–4.2)

## 2019-10-23 NOTE — Progress Notes (Signed)
Patient ID: Alison Bond, female   DOB: [birth year], 59 y.o.   MRN: [id number]          Reason for Appointment: Follow-up for thyroid    History of Present Illness:   The patient's thyroid enlargement was first discovered in 2014 Most likely this was diagnosed on a routine physical exam but details not available Review of outside records and labs indicate that she had a low TSH periodically since 2009 with the lowest level of 0.15 in 2015 Free T3 levels and free T4 levels have been consistently normal However in 2015 her PCP apparently treated her with methimazole for about a year but at that point she does not think she was having any symptoms of palpitations or unusual weight loss and she did not feel subjectively better with this 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 without any need for treatment she was told to follow-up in 1 year   RECENT HISTORY: On her follow-up in 5/21 she was 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 She was complaining of increasing fatigue for the last 2 to 3 years and especially in the prior month However was not having any typical symptoms of hyperthyroidism including recent weight loss although previously had lost weight with her cancer history  Also had no symptoms of palpitations, shakiness, heat intolerance or sweating prior to treatment  Thyroid scan did not show any abnormality and since uptake was normal at 23% I-131 treatment was not done Since 06/2019 she has been on PTU 50 mg twice daily No change in her energy level or weight with starting this regimen  Her dose was unchanged on her last visit 2 months ago She is taking her PTU regularly without side effects Her weight has improved with improved appetite also  Last thyroid levels are back to normal including T3 and TSH  TSH done in 10/2018 was 0.44 Free thyroxine index normal at 1.04  Lab Results  Component Value Date   FREET4 0.97 08/26/2019    FREET4 1.33 06/11/2019   FREET4 0.94 12/06/2016   TSH 0.41 08/26/2019   TSH 0.43 07/22/2019   TSH 0.22 (L) 06/11/2019   Lab Results  Component Value Date   T3FREE 3.4 08/26/2019   T3FREE 3.3 07/22/2019   T3FREE 4.3 (H) 06/11/2019    She has had an ultrasound exam in  2014 which showed multiple small thyroid nodules and diffuse heterogenous echogenicity, largest nodule 1.1 cm on the left, mixed solid and cystic, well circumscribed Follow-up ultrasound done in 2017 apparently showed no change, this was done by a general surgeon who had been consulted for her goiter.     Allergies as of 10/23/2019      Reactions   Aspirin    Patient has ulcers and hurts her stomach and has a tight chest      Medication List       Accurate as of October 23, 2019  2:56 PM. If you have any questions, ask your nurse or doctor.        atenolol 50 MG tablet Commonly known as: TENORMIN TAKE 2 TABLETS BY MOUTH EVERY DAY IN THE MORNING   Biotin 1000 MCG tablet Take 5,000 mcg by mouth daily.   brimonidine 0.2 % ophthalmic solution Commonly known as: ALPHAGAN Apply to eye.   busPIRone 15 MG tablet Commonly known as: BUSPAR TAKE 0.5-1 TABS BY MOUTH THREE TIMES DAILY AS NEEDED FOR OTHER (ANXIETY)   CENTRUM ADULTS  PO Take 1 tablet by mouth daily.   diclofenac sodium 1 % Gel Commonly known as: VOLTAREN Apply topically 2 (two) times daily.   DULoxetine 60 MG capsule Commonly known as: CYMBALTA Take by mouth.   fluticasone 50 MCG/ACT nasal spray Commonly known as: FLONASE Place into the nose.   losartan 50 MG tablet Commonly known as: COZAAR TAKE 1 TABLET BY MOUTH EVERY DAY IN THE MORNING   Lumigan 0.01 % Soln Generic drug: bimatoprost 1 Drop In rt eye. Dr Alferd Patee   meloxicam 15 MG tablet Commonly known as: MOBIC TAKE 1 TAB BY MOUTH EVERY DAY TAKE WITH FOOD (FOR JOINT PAIN)   mirtazapine 7.5 MG tablet Commonly known as: REMERON Take 7.5 mg by mouth at bedtime.   omeprazole  40 MG capsule Commonly known as: PRILOSEC TAKE 1 CAPSULE BY MOUTH EVERY EVENING BEFORE MEAL   oxyCODONE 15 MG immediate release tablet Commonly known as: ROXICODONE Take 15 mg by mouth 4 (four) times daily.   propylthiouracil 50 MG tablet Commonly known as: PTU Take 1 tablet (50 mg total) by mouth 2 (two) times daily.   vitamin B-12 100 MCG tablet Commonly known as: CYANOCOBALAMIN Take one daily.       Allergies:  Allergies  Allergen Reactions  . Aspirin     Patient has ulcers and hurts her stomach and has a tight chest    No past medical history on file.  No past surgical history on file.  Family History  Problem Relation Age of Onset  . Diabetes Mother   . Cancer Mother   . Hypertension Mother   . Cancer Paternal Aunt   . Thyroid disease Neg Hx     Social History:  reports that she has been smoking. She has never used smokeless tobacco. No history on file for alcohol use and drug use.    Review of Systems Wt Readings from Last 3 Encounters:  10/23/19 106 lb (48.1 kg)  08/26/19 103 lb 3.2 oz (46.8 kg)  06/11/19 106 lb 12.8 oz (48.4 kg)      Examination:   BP 116/62   Pulse 69   Ht 5\' 3"  (1.6 m)   Wt 106 lb (48.1 kg)   SpO2 96%   BMI 18.78 kg/m    Thyroid exam: Right lobe is 2-1/2-3 times normal, slightly firm but relatively smooth.  No distinct nodules felt Left lobe is about twice normal and smooth, firm Mild enlargement of the isthmus present  Triceps reflexes are normal No tremor     Assessment/Plan:  Multinodular goiter, small and long-standing with largest nodule 1.1 cm, evaluated last in 2017 and previously also in 2014 with stable findings  She has autonomous function of the thyroid with low TSH levels for several years but more recently has had subclinical hyperthyroidism with high free T3 level.  Thyrotropin receptor antibody normal  Previously thyroid scan did not show any hot or warm areas and normal I-131 uptake  Because of  subclinical hyperthyroidism she is now on PTU since June which normalized her thyroid levels She is subjectively doing well She has had weight loss unrelated to her hyperthyroidism and this is improving  Recommendations: We will check her thyroid levels again including free T3 level PTU will be adjusted based on labs and will most likely will need follow-up in 2 months again  FATIGUE: This is also improving although she is still having insomnia    July 10/23/2019  Addendum Thyroid levels including TSH normal: Stay  on same Rx

## 2019-10-23 NOTE — Progress Notes (Signed)
Please call to let patient know that the thyroid results are normal, stay on the same dose of PTU and return in 2 months again

## 2019-11-07 ENCOUNTER — Other Ambulatory Visit: Payer: Self-pay | Admitting: Endocrinology

## 2019-12-13 ENCOUNTER — Other Ambulatory Visit: Payer: Self-pay | Admitting: Endocrinology

## 2019-12-18 ENCOUNTER — Ambulatory Visit: Payer: Medicare HMO | Admitting: Endocrinology

## 2019-12-27 LAB — TSH: TSH: 3.2 (ref 0.41–5.90)

## 2019-12-30 ENCOUNTER — Encounter: Payer: Self-pay | Admitting: Endocrinology

## 2019-12-30 ENCOUNTER — Encounter: Payer: Self-pay | Admitting: *Deleted

## 2019-12-30 ENCOUNTER — Other Ambulatory Visit: Payer: Self-pay

## 2019-12-30 ENCOUNTER — Ambulatory Visit: Payer: Medicare HMO | Admitting: Endocrinology

## 2019-12-30 VITALS — BP 118/68 | HR 80 | Ht 63.0 in | Wt 105.6 lb

## 2019-12-30 DIAGNOSIS — E0520 Thyrotoxicosis with toxic multinodular goiter without thyrotoxic crisis or storm: Secondary | ICD-10-CM

## 2019-12-30 LAB — CBC
HCT: 33.6 % — ABNORMAL LOW (ref 36.0–46.0)
Hemoglobin: 11.8 g/dL — ABNORMAL LOW (ref 12.0–15.0)
MCHC: 35 g/dL (ref 30.0–36.0)
MCV: 98.1 fl (ref 78.0–100.0)
Platelets: 194 10*3/uL (ref 150.0–400.0)
RBC: 3.43 Mil/uL — ABNORMAL LOW (ref 3.87–5.11)
RDW: 11.9 % (ref 11.5–15.5)
WBC: 4.1 10*3/uL (ref 4.0–10.5)

## 2019-12-30 NOTE — Addendum Note (Signed)
Addended by: Adline Mango I on: 12/30/2019 01:45 PM   Modules accepted: Orders

## 2019-12-30 NOTE — Progress Notes (Signed)
Patient ID: Alison Bond, female   DOB: 1960-01-24, 59 y.o.   MRN: [id number]          Reason for Appointment: Follow-up for thyroid    History of Present Illness:   The patient's thyroid enlargement was first discovered in 2014 Most likely this was diagnosed on a routine physical exam but details not available Review of outside records and labs indicate that she had a low TSH periodically since 2009 with the lowest level of 0.15 in 2015 Free T3 levels and free T4 levels have been consistently normal However in 2015 her PCP apparently treated her with methimazole for about a year but at that point she does not think she was having any symptoms of palpitations or unusual weight loss and she did not feel subjectively better with this 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 without any need for treatment she was told to follow-up in 1 year   RECENT HISTORY: On her follow-up in 5/21 she was 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 She was complaining of increasing fatigue for the last 2 to 3 years and especially in the prior month However was not having any typical symptoms of hyperthyroidism including recent weight loss although previously had lost weight with her cancer history  Also had no symptoms of palpitations, shakiness, heat intolerance or sweating prior to treatment  Thyroid scan did not show any abnormality and since uptake was normal at 23% I-131 treatment was not done Since 06/2019 she has been on PTU 50 mg twice daily No change in her energy level or weight with starting this regimen  Her dose was unchanged on her last visit 2 months ago She is taking her PTU regularly   She is maintaining her weight and does not think she has a decreased appetite, palpitations, shakiness or heat intolerance  Labs are pending She said that she has been taking biotin for hair for some time  Wt Readings from Last 3 Encounters:  12/30/19 105 lb  9.6 oz (47.9 kg)  10/23/19 106 lb (48.1 kg)  08/26/19 103 lb 3.2 oz (46.8 kg)   TSH done in 10/2018 was 0.44 Free thyroxine index normal at 1.04  Lab Results  Component Value Date   FREET4 0.82 10/23/2019   FREET4 0.97 08/26/2019   FREET4 1.33 06/11/2019   TSH 0.53 10/23/2019   TSH 0.41 08/26/2019   TSH 0.43 07/22/2019   Lab Results  Component Value Date   T3FREE 3.4 10/23/2019   T3FREE 3.4 08/26/2019   T3FREE 3.3 07/22/2019    She has had an ultrasound exam in  2014 which showed multiple small thyroid nodules and diffuse heterogenous echogenicity, largest nodule 1.1 cm on the left, mixed solid and cystic, well circumscribed Follow-up ultrasound done in 2017 apparently showed no change, this was done by a general surgeon who had been consulted for her goiter.     Allergies as of 12/30/2019      Reactions   Aspirin    Patient has ulcers and hurts her stomach and has a tight chest      Medication List       Accurate as of December 30, 2019 12:08 PM. If you have any questions, ask your nurse or doctor.        atenolol 50 MG tablet Commonly known as: TENORMIN TAKE 2 TABLETS BY MOUTH EVERY DAY IN THE MORNING   bimatoprost 0.01 % Soln Commonly known as: LUMIGAN 1  Drop In rt eye. Dr Alferd Patee   Biotin 1000 MCG tablet Take 5,000 mcg by mouth daily.   brimonidine 0.2 % ophthalmic solution Commonly known as: ALPHAGAN Apply to eye.   busPIRone 15 MG tablet Commonly known as: BUSPAR TAKE 0.5-1 TABS BY MOUTH THREE TIMES DAILY AS NEEDED FOR OTHER (ANXIETY)   CENTRUM ADULTS PO Take 1 tablet by mouth daily.   diclofenac sodium 1 % Gel Commonly known as: VOLTAREN Apply topically 2 (two) times daily.   DULoxetine 60 MG capsule Commonly known as: CYMBALTA Take by mouth.   fluticasone 50 MCG/ACT nasal spray Commonly known as: FLONASE Place into the nose.   losartan 50 MG tablet Commonly known as: COZAAR TAKE 1 TABLET BY MOUTH EVERY DAY IN THE MORNING    meloxicam 15 MG tablet Commonly known as: MOBIC TAKE 1 TAB BY MOUTH EVERY DAY TAKE WITH FOOD (FOR JOINT PAIN)   mirtazapine 7.5 MG tablet Commonly known as: REMERON Take 7.5 mg by mouth at bedtime.   omeprazole 40 MG capsule Commonly known as: PRILOSEC TAKE 1 CAPSULE BY MOUTH EVERY EVENING BEFORE MEAL   oxyCODONE 15 MG immediate release tablet Commonly known as: ROXICODONE Take 15 mg by mouth 4 (four) times daily.   propylthiouracil 50 MG tablet Commonly known as: PTU TAKE 1 TABLET BY MOUTH TWICE A DAY   vitamin B-12 100 MCG tablet Commonly known as: CYANOCOBALAMIN Take one daily.   vitamin C 1000 MG tablet Take 1,000 mg by mouth daily.   Vitamin D3 1.25 MG (50000 UT) Tabs Take by mouth.   zinc gluconate 50 MG tablet Take 50 mg by mouth daily.       Allergies:  Allergies  Allergen Reactions  . Aspirin     Patient has ulcers and hurts her stomach and has a tight chest    No past medical history on file.  No past surgical history on file.  Family History  Problem Relation Age of Onset  . Diabetes Mother   . Cancer Mother   . Hypertension Mother   . Cancer Paternal Aunt   . Thyroid disease Neg Hx     Social History:  reports that she has been smoking. She has never used smokeless tobacco. No history on file for alcohol use and drug use.    Review of Systems Wt Readings from Last 3 Encounters:  12/30/19 105 lb 9.6 oz (47.9 kg)  10/23/19 106 lb (48.1 kg)  08/26/19 103 lb 3.2 oz (46.8 kg)    Hypertension: Followed by PCP with good control  She has had her Covid booster   Examination:   BP 118/68   Pulse 80   Ht 5\' 3"  (1.6 m)   Wt 105 lb 9.6 oz (47.9 kg)   SpO2 98%   BMI 18.71 kg/m    Thyroid exam:  Right lobe is 2-1/2  times normal, slightly firm but smooth without any palpable nodules  Left lobe is about twice normal and smooth, firm No lymphadenopathy of the neck  Reflexes difficult to elicit but appear normal at triceps  No  tremor   No edema  Assessment/Plan:  Multinodular goiter, small and long-standing with largest nodule 1.1 cm, evaluated last in 2017 and previously also in 2014 with stable findings  She has autonomous function of the thyroid with low TSH levels for several years but more recently has had subclinical hyperthyroidism with high free T3 level.  Thyrotropin receptor antibody normal  Previously thyroid scan did not show any  hot or warm areas and normal I-131 uptake  Because of subclinical hyperthyroidism and high free T3 level she is on PTU since June 2021 which normalized her thyroid levels No new complaints She has had weight loss unrelated to her hyperthyroidism previously Her thyroid enlargement is about the same or slightly smaller  Recommendations: Need to check her thyroid levels again including free T3 level PTU will be adjusted based on labs Follow-up in 55-month Also recommended that she stop taking biotin as this is not likely to help her hair at this point and may interfere with her labs    Reather Littler 12/30/2019   Labs received from [facility] show TSH of 0.53 otherwise free T4 1.2 and free T3 3.2 She will continue same regimen, follow-up in about 2 months

## 2020-03-12 ENCOUNTER — Other Ambulatory Visit: Payer: Self-pay

## 2020-03-16 ENCOUNTER — Ambulatory Visit: Payer: Medicare HMO | Admitting: Endocrinology

## 2020-03-31 ENCOUNTER — Ambulatory Visit (INDEPENDENT_AMBULATORY_CARE_PROVIDER_SITE_OTHER): Payer: Medicare HMO | Admitting: Endocrinology

## 2020-03-31 ENCOUNTER — Other Ambulatory Visit: Payer: Self-pay

## 2020-03-31 ENCOUNTER — Encounter: Payer: Self-pay | Admitting: Endocrinology

## 2020-03-31 DIAGNOSIS — E0520 Thyrotoxicosis with toxic multinodular goiter without thyrotoxic crisis or storm: Secondary | ICD-10-CM | POA: Diagnosis not present

## 2020-03-31 LAB — TSH: TSH: 0.46 u[IU]/mL (ref 0.35–4.50)

## 2020-03-31 LAB — T3, FREE: T3, Free: 3.1 pg/mL (ref 2.3–4.2)

## 2020-03-31 NOTE — Progress Notes (Signed)
Patient ID: Alison Bond, female   DOB: 1960/10/03, 60 y.o.   MRN: [id number]          Reason for Appointment: Follow-up for thyroid    History of Present Illness:   The patient's thyroid enlargement was first discovered in 2014 Most likely this was diagnosed on a routine physical exam but details not available Review of outside records and labs indicate that she had a low TSH periodically since 2009 with the lowest level of 0.15 in 2015 Free T3 levels and free T4 levels have been consistently normal However in 2015 her PCP apparently treated her with methimazole for about a year but at that point she does not think she was having any symptoms of palpitations or unusual weight loss and she did not feel subjectively better with this 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 without any need for treatment she was told to follow-up in 1 year   RECENT HISTORY: On her follow-up in 5/21 she was 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 She was complaining of increasing fatigue for the last 2 to 3 years and especially in the prior month However was not having any typical symptoms of hyperthyroidism including recent weight loss although previously had lost weight with her cancer history  Also had no symptoms of palpitations, shakiness, heat intolerance or sweating prior to treatment  Thyroid scan did not show any abnormality and since uptake was normal at 23% I-131 treatment was not done Since 06/2019 she has been on PTU 50 mg twice daily No change in her energy level or weight with starting this regimen  Her dose was unchanged on her last visit 3 months ago; only TSH level was available from outside labs She is taking her PTU daily  Appetite is normal and weight is at least better than in December No fatigue, palpitations or shakiness  Labs are pending She had start biotin as instructed, currently taking Centrum  Wt Readings from Last 3  Encounters:  03/31/20 107 lb (48.5 kg)  12/30/19 105 lb 9.6 oz (47.9 kg)  10/23/19 106 lb (48.1 kg)   TSH done in 10/2018 was 0.44 Free thyroxine index normal at 1.04  Lab Results  Component Value Date   FREET4 0.82 10/23/2019   FREET4 0.97 08/26/2019   FREET4 1.33 06/11/2019   TSH 3.20 12/27/2019   TSH 0.53 10/23/2019   TSH 0.41 08/26/2019   Lab Results  Component Value Date   T3FREE 3.4 10/23/2019   T3FREE 3.4 08/26/2019   T3FREE 3.3 07/22/2019    She has had an ultrasound exam in  2014 which showed multiple small thyroid nodules and diffuse heterogenous echogenicity, largest nodule 1.1 cm on the left, mixed solid and cystic, well circumscribed Follow-up ultrasound done in 2017 apparently showed no change, this was done by a general surgeon who had been consulted for her goiter.     Allergies as of 03/31/2020      Reactions   Aspirin    Patient has ulcers and hurts her stomach and has a tight chest      Medication List       Accurate as of March 31, 2020  2:27 PM. If you have any questions, ask your nurse or doctor.        atenolol 50 MG tablet Commonly known as: TENORMIN TAKE 2 TABLETS BY MOUTH EVERY DAY IN THE MORNING   bimatoprost 0.01 % Soln Commonly known as: LUMIGAN 1  Drop In rt eye. Dr Alferd Patee   Biotin 1000 MCG tablet Take 5,000 mcg by mouth daily.   brimonidine 0.2 % ophthalmic solution Commonly known as: ALPHAGAN Apply to eye.   busPIRone 15 MG tablet Commonly known as: BUSPAR TAKE 0.5-1 TABS BY MOUTH THREE TIMES DAILY AS NEEDED FOR OTHER (ANXIETY)   CENTRUM ADULTS PO Take 1 tablet by mouth daily.   diclofenac sodium 1 % Gel Commonly known as: VOLTAREN Apply topically 2 (two) times daily.   DULoxetine 60 MG capsule Commonly known as: CYMBALTA Take by mouth.   fluticasone 50 MCG/ACT nasal spray Commonly known as: FLONASE Place into the nose.   losartan 50 MG tablet Commonly known as: COZAAR TAKE 1 TABLET BY MOUTH EVERY DAY IN  THE MORNING   meloxicam 15 MG tablet Commonly known as: MOBIC TAKE 1 TAB BY MOUTH EVERY DAY TAKE WITH FOOD (FOR JOINT PAIN)   mirtazapine 7.5 MG tablet Commonly known as: REMERON Take 7.5 mg by mouth at bedtime.   omeprazole 40 MG capsule Commonly known as: PRILOSEC TAKE 1 CAPSULE BY MOUTH EVERY EVENING BEFORE MEAL   oxyCODONE 15 MG immediate release tablet Commonly known as: ROXICODONE Take 15 mg by mouth 4 (four) times daily.   propylthiouracil 50 MG tablet Commonly known as: PTU TAKE 1 TABLET BY MOUTH TWICE A DAY   vitamin B-12 100 MCG tablet Commonly known as: CYANOCOBALAMIN Take one daily.   vitamin C 1000 MG tablet Take 1,000 mg by mouth daily.   Vitamin D3 1.25 MG (50000 UT) Tabs Take by mouth.   zinc gluconate 50 MG tablet Take 50 mg by mouth daily.       Allergies:  Allergies  Allergen Reactions  . Aspirin     Patient has ulcers and hurts her stomach and has a tight chest    No past medical history on file.  No past surgical history on file.  Family History  Problem Relation Age of Onset  . Diabetes Mother   . Cancer Mother   . Hypertension Mother   . Cancer Paternal Aunt   . Thyroid disease Neg Hx     Social History:  reports that she has been smoking. She has never used smokeless tobacco. No history on file for alcohol use and drug use.    Review of Systems Wt Readings from Last 3 Encounters:  03/31/20 107 lb (48.5 kg)  12/30/19 105 lb 9.6 oz (47.9 kg)  10/23/19 106 lb (48.1 kg)    Hypertension: Followed by PCP with good control  She has had her Covid booster   Examination:   BP 116/62 (BP Location: Left Arm, Patient Position: Sitting, Cuff Size: Normal)   Pulse 68   Resp 18   Ht 5\' 3"  (1.6 m)   Wt 107 lb (48.5 kg)   SpO2 96%   BMI 18.95 kg/m    Thyroid exam:  Right lobe is 2-1/2  times normal, slightly firm and smooth without any palpable nodules  Left lobe is about twice normal and smooth, firm especially in the  medial area No lymphadenopathy of the neck  Reflexes difficult to elicit   No tremor   Assessment/Plan:  Multinodular goiter, small and long-standing with largest nodule 1.1 cm, evaluated last in 2017 and previously also in 2014 with stable findings  She has autonomous function of the thyroid with low TSH levels for several years but more recently has had subclinical hyperthyroidism with high free T3 level.  Thyrotropin receptor antibody normal  Previously thyroid scan did not show any hot or warm areas and normal I-131 uptake  Because of subclinical hyperthyroidism and high free T3 level she is on PTU since June 2021 Subsequently has had normal thyroid levels  She has had weight loss unrelated to her hyperthyroidism previously Recently subjectively doing well  Her thyroid enlargement is unchanged, no nodule palpable separately  Recommendations: She will go to the lab and check her thyroid levels today including free T3 level PTU will be adjusted based on labs Follow-up to be decided     Reather Littler 03/31/2020

## 2020-04-01 LAB — T4, FREE: Free T4: 0.64 ng/dL (ref 0.60–1.60)

## 2020-04-02 NOTE — Progress Notes (Signed)
Thyroid levels are lower, to stop PTU prescription until next visit

## 2020-04-07 ENCOUNTER — Other Ambulatory Visit: Payer: Self-pay | Admitting: Endocrinology

## 2020-05-27 ENCOUNTER — Ambulatory Visit: Payer: Medicare HMO | Admitting: Endocrinology

## 2020-05-29 ENCOUNTER — Ambulatory Visit (INDEPENDENT_AMBULATORY_CARE_PROVIDER_SITE_OTHER): Payer: Medicare HMO | Admitting: Endocrinology

## 2020-05-29 ENCOUNTER — Other Ambulatory Visit: Payer: Self-pay

## 2020-05-29 VITALS — BP 96/60 | HR 73 | Ht 63.0 in | Wt 114.2 lb

## 2020-05-29 DIAGNOSIS — E0520 Thyrotoxicosis with toxic multinodular goiter without thyrotoxic crisis or storm: Secondary | ICD-10-CM

## 2020-05-29 LAB — T4, FREE: Free T4: 0.85 ng/dL (ref 0.60–1.60)

## 2020-05-29 LAB — TSH: TSH: 0.92 u[IU]/mL (ref 0.35–4.50)

## 2020-05-29 LAB — T3, FREE: T3, Free: 3.2 pg/mL (ref 2.3–4.2)

## 2020-05-29 NOTE — Progress Notes (Signed)
Patient ID: Alison Bond, female   DOB: 06/02/1960, 60 y.o.   MRN: [id number]          Reason for Appointment: Follow-up for thyroid    History of Present Illness:   The patient's thyroid enlargement was first discovered in 2014 Most likely this was diagnosed on a routine physical exam but details not available Review of outside records and labs indicate that she had a low TSH periodically since 2009 with the lowest level of 0.15 in 2015 Free T3 levels and free T4 levels have been consistently normal However in 2015 her PCP apparently treated her with methimazole for about a year but at that point she does not think she was having any symptoms of palpitations or unusual weight loss and she did not feel subjectively better with this 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 without any need for treatment she was told to follow-up in 1 year   RECENT HISTORY: On her follow-up in 5/21 she was 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 She was complaining of increasing fatigue for the last 2 to 3 years and especially in the prior month Otherwise had no symptoms of palpitations, shakiness, heat intolerance or sweating prior to treatment  Thyroid scan did not show any abnormality and since uptake was normal at 23% I-131 treatment was not done Thyrotropin receptor antibody normal in 08/2019  Since 06/2019 she has been on PTU 50 mg twice daily  She has continued the same dose even though she was supposed to stop her PTU in 3/22 for low normal free T4; apparently she never got the message She appears to have gained weight since her last visit and has had better appetite  No heat or cold intolerance, palpitations or shakiness  She does have occasional fatigue and may at times have some sweating  Labs are pending She had stopped her biotin as instructed, still taking Centrum  Wt Readings from Last 3 Encounters:  05/29/20 114 lb 3.2 oz (51.8 kg)   03/31/20 107 lb (48.5 kg)  12/30/19 105 lb 9.6 oz (47.9 kg)    Lab Results  Component Value Date   FREET4 0.64 03/31/2020   FREET4 0.82 10/23/2019   FREET4 0.97 08/26/2019   TSH 0.46 03/31/2020   TSH 3.20 12/27/2019   TSH 0.53 10/23/2019   Lab Results  Component Value Date   T3FREE 3.1 03/31/2020   T3FREE 3.4 10/23/2019   T3FREE 3.4 08/26/2019    She has had an ultrasound exam in  2014 which showed multiple small thyroid nodules and diffuse heterogenous echogenicity, largest nodule 1.1 cm on the left, mixed solid and cystic, well circumscribed Follow-up ultrasound done in 2017 apparently showed no change, this was done by a general surgeon who had been consulted for her goiter.     Allergies as of 05/29/2020      Reactions   Aspirin Other (See Comments)   Patient has ulcers and hurts her stomach and has a tight chest      Medication List       Accurate as of May 29, 2020 11:16 AM. If you have any questions, ask your nurse or doctor.        atenolol 50 MG tablet Commonly known as: TENORMIN TAKE 2 TABLETS BY MOUTH EVERY DAY IN THE MORNING   bimatoprost 0.01 % Soln Commonly known as: LUMIGAN 1 Drop In rt eye. Dr Alferd Patee   Biotin 1000 MCG tablet Take 5,000  mcg by mouth daily.   brimonidine 0.2 % ophthalmic solution Commonly known as: ALPHAGAN Apply to eye.   busPIRone 15 MG tablet Commonly known as: BUSPAR TAKE 0.5-1 TABS BY MOUTH THREE TIMES DAILY AS NEEDED FOR OTHER (ANXIETY)   CENTRUM ADULTS PO Take 1 tablet by mouth daily.   diclofenac sodium 1 % Gel Commonly known as: VOLTAREN Apply topically 2 (two) times daily.   DULoxetine 60 MG capsule Commonly known as: CYMBALTA Take by mouth.   fluticasone 50 MCG/ACT nasal spray Commonly known as: FLONASE Place into the nose.   losartan 50 MG tablet Commonly known as: COZAAR TAKE 1 TABLET BY MOUTH EVERY DAY IN THE MORNING   meloxicam 15 MG tablet Commonly known as: MOBIC TAKE 1 TAB BY MOUTH EVERY  DAY TAKE WITH FOOD (FOR JOINT PAIN)   mirtazapine 15 MG tablet Commonly known as: REMERON Take 15 mg by mouth at bedtime.   omeprazole 40 MG capsule Commonly known as: PRILOSEC TAKE 1 CAPSULE BY MOUTH EVERY EVENING BEFORE MEAL   oxyCODONE 15 MG immediate release tablet Commonly known as: ROXICODONE Take 15 mg by mouth 4 (four) times daily.   propylthiouracil 50 MG tablet Commonly known as: PTU TAKE 1 TABLET BY MOUTH TWICE A DAY   vitamin B-12 100 MCG tablet Commonly known as: CYANOCOBALAMIN Take one daily.   vitamin C 1000 MG tablet Take 1,000 mg by mouth daily.   Vitamin D3 1.25 MG (50000 UT) Tabs Take by mouth.   zinc gluconate 50 MG tablet Take 50 mg by mouth daily.       Allergies:  Allergies  Allergen Reactions  . Aspirin Other (See Comments)    Patient has ulcers and hurts her stomach and has a tight chest    No past medical history on file.  No past surgical history on file.  Family History  Problem Relation Age of Onset  . Diabetes Mother   . Cancer Mother   . Hypertension Mother   . Cancer Paternal Aunt   . Thyroid disease Neg Hx     Social History:  reports that she has been smoking. She has never used smokeless tobacco. No history on file for alcohol use and drug use.    Review of Systems Wt Readings from Last 3 Encounters:  05/29/20 114 lb 3.2 oz (51.8 kg)  03/31/20 107 lb (48.5 kg)  12/30/19 105 lb 9.6 oz (47.9 kg)    Hypertension: Followed by PCP with good control     Examination:   BP 96/60   Pulse 73   Ht 5\' 3"  (1.6 m)   Wt 114 lb 3.2 oz (51.8 kg)   SpO2 99%   BMI 20.23 kg/m    Thyroid exam:  Right lobe is nearly 3 times normal, relatively firm and smooth without any distinct nodules  Left lobe is about twice normal and smooth, no nodules palpated No lymphadenopathy of the neck  Biceps reflexes appear normal  No ankle edema   Assessment/Plan:  Multinodular goiter, small and long-standing with largest nodule  1.1 cm, evaluated last in 2017 and previously also in 2014 with stable findings  She has autonomous function of the thyroid with low TSH levels for several years but more recently developed subclinical hyperthyroidism with high free T3 level.  Thyrotropin receptor antibody normal  Previously thyroid scan did not show any hot or warm areas and normal I-131 uptake  Because of subclinical hyperthyroidism and high free T3 level she is on PTU since  June 2021 Subsequently has had normal thyroid levels although free T4 was low normal in 3/22  Did not have any subjective change in symptoms with PTU therapy  More recently has gained weight and is having better appetite although occasionally will have fatigue  Her thyroid enlargement is about the same  Recommendations: Thyroid levels to be checked today  PTU will be adjusted based on labs Follow-up to be decided     Reather Littler 05/29/2020   Addendum: Free T4 and free T3 are quite normal, TSH 0.92, will continue PTU for another 3 months

## 2020-06-11 ENCOUNTER — Ambulatory Visit: Payer: Medicare HMO | Admitting: Endocrinology

## 2020-08-04 ENCOUNTER — Ambulatory Visit: Payer: Medicare HMO | Admitting: Endocrinology

## 2020-08-09 IMAGING — NM NM THYROID IMAGING W/ UPTAKE MULTI (4&24 HR)
4 series · 4 of 4 positions shown · non-contrast
Comparison: None.

CLINICAL DATA: Nervousness, weight loss, diarrhea, decreased
appetite, difficulty sleeping. Irritable. Increased thirst. Heart
palpitations. Fatigue. Dry skin. TSH equal 0.22 on 06/11/2019.

EXAM:
THYROID SCAN AND UPTAKE - 4 AND 24 HOURS
TECHNIQUE: Following oral administration of I-YLV capsule, anterior planar
imaging was acquired at 24 hours. Thyroid uptake was calculated with
a thyroid probe at 4-6 hours and 24 hours.
RADIOPHARMACEUTICALS:  442 uCi I-YLV sodium iodide p.o.

[Series 1: anterior · 1.18mm/px · 1 of 1 slices shown]
[im 1/1  full-range]
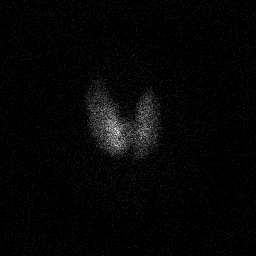

[Series 2: ant w marker · 1.18mm/px · 1 of 1 slices shown]
[im 1/1  full-range]
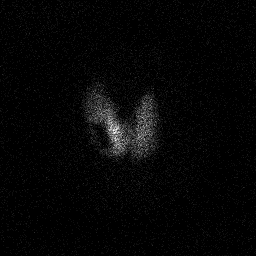

[Series 3: lao · 1.18mm/px · 1 of 1 slices shown]
[im 1/1  full-range]
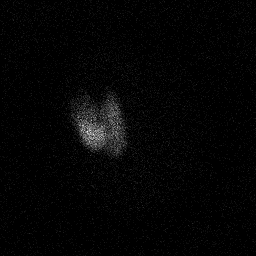

[Series 4: rao · 1.18mm/px · 1 of 1 slices shown]
[im 1/1  full-range]
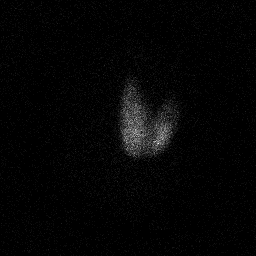

[4 of 4 positions shown; findings below may reference images not displayed]

FINDINGS: Uniform uptake in the thyroid gland. No nodularity. Mild gland
enlargement.

4 hour I-YLV uptake = 9% (normal 5-20%)

24 hour I-YLV uptake = 23% (normal 10-30%)
IMPRESSION: Normal thyroid scan and I 123 uptake.

Please select correct template:

## 2020-09-10 ENCOUNTER — Ambulatory Visit: Payer: Medicare HMO | Admitting: Endocrinology

## 2020-09-29 ENCOUNTER — Other Ambulatory Visit: Payer: Self-pay | Admitting: Internal Medicine

## 2020-10-22 ENCOUNTER — Ambulatory Visit (INDEPENDENT_AMBULATORY_CARE_PROVIDER_SITE_OTHER): Payer: Medicare HMO | Admitting: Endocrinology

## 2020-10-22 ENCOUNTER — Other Ambulatory Visit: Payer: Self-pay

## 2020-10-22 VITALS — BP 118/70 | HR 85 | Ht 63.0 in | Wt 116.0 lb

## 2020-10-22 DIAGNOSIS — E0520 Thyrotoxicosis with toxic multinodular goiter without thyrotoxic crisis or storm: Secondary | ICD-10-CM | POA: Diagnosis not present

## 2020-10-22 NOTE — Progress Notes (Signed)
Patient ID: Alison Bond, female   DOB: 1960/02/24, 60 y.o.   MRN: [id number]          Reason for Appointment: Follow-up for thyroid    History of Present Illness:   The patient's thyroid enlargement was first discovered in 2014 Most likely this was diagnosed on a routine physical exam but details not available Review of outside records and labs indicate that she had a low TSH periodically since 2009 with the lowest level of 0.15 in 2015 Free T3 levels and free T4 levels have been consistently normal However in 2015 her PCP apparently treated her with methimazole for about a year but at that point she does not think she was having any symptoms of palpitations or unusual weight loss and she did not feel subjectively better with this 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 without any need for treatment she was told to follow-up in 1 year   RECENT HISTORY: On her follow-up in 5/21 she was 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 She was complaining of increasing fatigue for the last 2 to 3 years and especially in the prior month Otherwise had no symptoms of palpitations, shakiness, heat intolerance or sweating prior to treatment  Thyroid scan did not show any abnormality and since uptake was normal at 23% I-131 treatment was not done Thyrotropin receptor antibody normal in 08/2019  Since 06/2019 she has been on PTU 50 mg twice daily  She has continued the same dose, her thyroid levels were quite normal when her labs were checked in 5/22  She has had a good appetite and her weight is slightly improved No heat or cold intolerance, unusual fatigue, palpitations or shakiness  Labs are pending She does take Centrum vitamins but not biotin  Wt Readings from Last 3 Encounters:  10/22/20 116 lb (52.6 kg)  05/29/20 114 lb 3.2 oz (51.8 kg)  03/31/20 107 lb (48.5 kg)    Lab Results  Component Value Date   FREET4 0.85 05/29/2020   FREET4 0.64  03/31/2020   FREET4 0.82 10/23/2019   TSH 0.92 05/29/2020   TSH 0.46 03/31/2020   TSH 3.20 12/27/2019   Lab Results  Component Value Date   T3FREE 3.2 05/29/2020   T3FREE 3.1 03/31/2020   T3FREE 3.4 10/23/2019    She has had an ultrasound exam in  2014 which showed multiple small thyroid nodules and diffuse heterogenous echogenicity, largest nodule 1.1 cm on the left, mixed solid and cystic, well circumscribed Follow-up ultrasound done in 2017 apparently showed no change, this was done by a general surgeon who had been consulted for her goiter.     Allergies as of 10/22/2020       Reactions   Aspirin Other (See Comments)   Patient has ulcers and hurts her stomach and has a tight chest        Medication List        Accurate as of October 22, 2020  9:07 PM. If you have any questions, ask your nurse or doctor.          STOP taking these medications    meloxicam 15 MG tablet Commonly known as: MOBIC Stopped by: Reather Littler, MD       TAKE these medications    atenolol 50 MG tablet Commonly known as: TENORMIN TAKE 2 TABLETS BY MOUTH EVERY DAY IN THE MORNING   bimatoprost 0.01 % Soln Commonly known as: LUMIGAN 1 Drop In  rt eye. Dr Alferd Patee   brimonidine 0.2 % ophthalmic solution Commonly known as: ALPHAGAN Apply to eye.   busPIRone 15 MG tablet Commonly known as: BUSPAR TAKE 0.5-1 TABS BY MOUTH THREE TIMES DAILY AS NEEDED FOR OTHER (ANXIETY)   CENTRUM ADULTS PO Take 1 tablet by mouth daily.   diclofenac sodium 1 % Gel Commonly known as: VOLTAREN Apply topically 2 (two) times daily.   DULoxetine 60 MG capsule Commonly known as: CYMBALTA Take by mouth.   fluticasone 50 MCG/ACT nasal spray Commonly known as: FLONASE Place into the nose.   losartan 50 MG tablet Commonly known as: COZAAR TAKE 1 TABLET BY MOUTH EVERY DAY IN THE MORNING   mirtazapine 15 MG tablet Commonly known as: REMERON Take 15 mg by mouth at bedtime.   omeprazole 40 MG  capsule Commonly known as: PRILOSEC TAKE 1 CAPSULE BY MOUTH EVERY EVENING BEFORE MEAL   oxyCODONE 15 MG immediate release tablet Commonly known as: ROXICODONE Take 15 mg by mouth 4 (four) times daily.   propylthiouracil 50 MG tablet Commonly known as: PTU TAKE 1 TABLET BY MOUTH TWICE A DAY   vitamin B-12 100 MCG tablet Commonly known as: CYANOCOBALAMIN Take one daily.   vitamin C 1000 MG tablet Take 1,000 mg by mouth daily.   Vitamin D3 1.25 MG (50000 UT) Tabs Take by mouth.   zinc gluconate 50 MG tablet Take 50 mg by mouth daily.        Allergies:  Allergies  Allergen Reactions   Aspirin Other (See Comments)    Patient has ulcers and hurts her stomach and has a tight chest    No past medical history on file.  No past surgical history on file.  Family History  Problem Relation Age of Onset   Diabetes Mother    Cancer Mother    Hypertension Mother    Cancer Paternal Aunt    Thyroid disease Neg Hx     Social History:  reports that she has been smoking. She has never used smokeless tobacco. No history on file for alcohol use and drug use.    Review of Systems    Hypertension: Followed by PCP      Examination:   BP 118/70   Pulse 85   Ht 5\' 3"  (1.6 m)   Wt 116 lb (52.6 kg)   SpO2 98%   BMI 20.55 kg/m    Thyroid exam: Neck circumference is 36 cm  Thyroid enlarged about 3 times normal diffusely, somewhat more on the right Thyroid texture is relatively smooth with more firm areas on the right side  No tremor  Biceps reflexes appear normal although difficult to elicit    Assessment/Plan:  Multinodular goiter, small and long-standing with largest nodule 1.1 cm, evaluated last in 2017 and previously also in 2014 with stable findings  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  Thyrotropin  receptor antibody normal  Previously thyroid scan did not show any hot or warm areas and normal I-131 uptake  She is subjectively doing well, as before has not usually had any symptoms before or after starting PTU  Her thyroid enlargement is still significant but no change from before and no local pressure symptoms  Recommendations: Thyroid levels to be checked today  If her T4 and T3 are trending lower will consider stopping PTU  Reather Littler 10/22/2020   Addendum: Free T4 and free T3 are quite normal, TSH 0.92, will continue PTU for another 3 months

## 2020-10-23 LAB — T3, FREE: T3, Free: 3.1 pg/mL (ref 2.3–4.2)

## 2020-10-23 LAB — T4, FREE: Free T4: 0.84 ng/dL (ref 0.60–1.60)

## 2020-10-23 LAB — TSH: TSH: 0.5 u[IU]/mL (ref 0.35–5.50)

## 2020-10-23 NOTE — Progress Notes (Signed)
Please call to let patient know that the lab results are same as before and we will continue her medication for now, follow-up in 3 months as scheduled

## 2021-01-27 ENCOUNTER — Ambulatory Visit: Payer: Medicare HMO | Admitting: Endocrinology

## 2021-02-09 ENCOUNTER — Ambulatory Visit: Payer: Medicare HMO | Admitting: Endocrinology

## 2021-02-15 ENCOUNTER — Ambulatory Visit: Payer: Medicare HMO | Admitting: Endocrinology

## 2021-02-19 ENCOUNTER — Other Ambulatory Visit: Payer: Self-pay

## 2021-02-19 ENCOUNTER — Ambulatory Visit (INDEPENDENT_AMBULATORY_CARE_PROVIDER_SITE_OTHER): Payer: Medicare HMO | Admitting: Endocrinology

## 2021-02-19 ENCOUNTER — Encounter: Payer: Self-pay | Admitting: Endocrinology

## 2021-02-19 VITALS — BP 116/76 | HR 87 | Ht 63.0 in | Wt 112.2 lb

## 2021-02-19 DIAGNOSIS — E0520 Thyrotoxicosis with toxic multinodular goiter without thyrotoxic crisis or storm: Secondary | ICD-10-CM

## 2021-02-19 DIAGNOSIS — E042 Nontoxic multinodular goiter: Secondary | ICD-10-CM | POA: Diagnosis not present

## 2021-02-19 LAB — T3, FREE: T3, Free: 3.6 pg/mL (ref 2.3–4.2)

## 2021-02-19 LAB — TSH: TSH: 0.3 u[IU]/mL — ABNORMAL LOW (ref 0.35–5.50)

## 2021-02-19 LAB — ALT: ALT: 10 U/L (ref 0–35)

## 2021-02-19 LAB — T4, FREE: Free T4: 0.82 ng/dL (ref 0.60–1.60)

## 2021-02-19 NOTE — Progress Notes (Signed)
Patient ID: Alison Bond, female   DOB: 08-29-1960, 61 y.o.   MRN: [id number]          Reason for Appointment: Follow-up for thyroid    History of Present Illness:   The patient's thyroid enlargement was first discovered in 2014 Most likely this was diagnosed on a routine physical exam but details not available Review of outside records and labs indicate that she had a low TSH periodically since 2009 with the lowest level of 0.15 in 2015 Free T3 levels and free T4 levels have been consistently normal However in 2015 her PCP apparently treated her with methimazole for about a year but at that point she does not think she was having any symptoms of palpitations or unusual weight loss and she did not feel subjectively better with this 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 without any need for treatment she was told to follow-up in 1 year   RECENT HISTORY: On her follow-up in 5/21 she was 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 She was complaining of increasing fatigue for the last 2 to 3 years and especially in the prior month Otherwise had no symptoms of palpitations, shakiness, heat intolerance or sweating prior to treatment  Thyroid scan did not show any abnormality and since uptake was normal at 23% I-131 treatment was not done Thyrotropin receptor antibody normal in 08/2019  Since 06/2019 she has been on PTU 50 mg twice daily  She has had control of her thyroid levels with this dose and this has been continued unchanged  She says her appetite fluctuates as well as her weight  No heat or cold intolerance, unusual fatigue, palpitations   Labs are pending She does take Centrum vitamins only  Wt Readings from Last 3 Encounters:  02/19/21 112 lb 3.2 oz (50.9 kg)  10/22/20 116 lb (52.6 kg)  05/29/20 114 lb 3.2 oz (51.8 kg)    Lab Results  Component Value Date   FREET4 0.84 10/22/2020   FREET4 0.85 05/29/2020   FREET4 0.64  03/31/2020   TSH 0.50 10/22/2020   TSH 0.92 05/29/2020   TSH 0.46 03/31/2020   Lab Results  Component Value Date   T3FREE 3.1 10/22/2020   T3FREE 3.2 05/29/2020   T3FREE 3.1 03/31/2020    She has had an ultrasound exam in  2014 which showed multiple small thyroid nodules and diffuse heterogenous echogenicity, largest nodule 1.1 cm on the left, mixed solid and cystic, well circumscribed Follow-up ultrasound done in 2017 apparently showed no change, this was done by a general surgeon who had been consulted for her goiter.     Allergies as of 02/19/2021       Reactions   Aspirin Other (See Comments)   Patient has ulcers and hurts her stomach and has a tight chest        Medication List        Accurate as of February 19, 2021  9:48 AM. If you have any questions, ask your nurse or doctor.          atenolol 50 MG tablet Commonly known as: TENORMIN TAKE 2 TABLETS BY MOUTH EVERY DAY IN THE MORNING   bimatoprost 0.01 % Soln Commonly known as: LUMIGAN 1 Drop In rt eye. Dr Alferd Patee   brimonidine 0.2 % ophthalmic solution Commonly known as: ALPHAGAN Apply to eye.   CENTRUM ADULTS PO Take 1 tablet by mouth daily.   diclofenac sodium 1 % Gel  Commonly known as: VOLTAREN Apply topically 2 (two) times daily.   DULoxetine 60 MG capsule Commonly known as: CYMBALTA Take by mouth.   fluticasone 50 MCG/ACT nasal spray Commonly known as: FLONASE Place into the nose.   losartan 50 MG tablet Commonly known as: COZAAR TAKE 1 TABLET BY MOUTH EVERY DAY IN THE MORNING   mirtazapine 15 MG tablet Commonly known as: REMERON Take 15 mg by mouth at bedtime.   omeprazole 40 MG capsule Commonly known as: PRILOSEC TAKE 1 CAPSULE BY MOUTH EVERY EVENING BEFORE MEAL   oxyCODONE 15 MG immediate release tablet Commonly known as: ROXICODONE Take 15 mg by mouth 4 (four) times daily.   propylthiouracil 50 MG tablet Commonly known as: PTU TAKE 1 TABLET BY MOUTH TWICE A DAY    vitamin B-12 100 MCG tablet Commonly known as: CYANOCOBALAMIN Take one daily.   vitamin C 1000 MG tablet Take 1,000 mg by mouth daily.   Vitamin D3 1.25 MG (50000 UT) Tabs Take by mouth.   zinc gluconate 50 MG tablet Take 50 mg by mouth daily.        Allergies:  Allergies  Allergen Reactions   Aspirin Other (See Comments)    Patient has ulcers and hurts her stomach and has a tight chest    No past medical history on file.  No past surgical history on file.  Family History  Problem Relation Age of Onset   Diabetes Mother    Cancer Mother    Hypertension Mother    Cancer Paternal Aunt    Thyroid disease Neg Hx     Social History:  reports that she has been smoking. She has never used smokeless tobacco. No history on file for alcohol use and drug use.    Review of Systems    Hypertension: Followed by PCP      Examination:   BP 116/76    Pulse 87    Ht 5\' 3"  (1.6 m)    Wt 112 lb 3.2 oz (50.9 kg)    SpO2 99%    BMI 19.88 kg/m    Thyroid exam: Neck circumference is 38 cm  Thyroid enlarged about 2-1/2 times normal on the right and about twice normal on the left Thyroid feels relatively fleshy with only a small firm area on the right  No lymphadenopathy in the neck  No tremor  Biceps reflexes appear normal but difficult to elicit    Assessment/Plan:  Multinodular goiter, long-standing with largest nodule 1.1 cm, evaluated last in 2017 and previously also in 2014 with stable findings  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  Thyrotropin receptor antibody normal  Previously thyroid scan did not show any hot or warm areas and normal I-131 uptake  No recent complaints of palpitations, unusual weight loss or fatigue  Her thyroid enlargement is appearing to be somewhat less especially on the left  side   Recommendations: Thyroid levels to be checked today  Unless TSH is trending lower will try to stop her PTU and observe without treatment     7/21 02/19/2021

## 2021-02-21 ENCOUNTER — Encounter: Payer: Self-pay | Admitting: Endocrinology

## 2021-04-11 ENCOUNTER — Encounter: Payer: Self-pay | Admitting: Endocrinology

## 2021-04-24 ENCOUNTER — Other Ambulatory Visit: Payer: Self-pay | Admitting: Endocrinology

## 2021-06-08 ENCOUNTER — Encounter: Payer: Self-pay | Admitting: Endocrinology

## 2021-06-25 ENCOUNTER — Encounter: Payer: Self-pay | Admitting: Endocrinology

## 2021-06-25 ENCOUNTER — Ambulatory Visit (INDEPENDENT_AMBULATORY_CARE_PROVIDER_SITE_OTHER): Payer: Medicare HMO | Admitting: Endocrinology

## 2021-06-25 VITALS — BP 124/70 | HR 69 | Ht 63.0 in | Wt 106.8 lb

## 2021-06-25 DIAGNOSIS — E0520 Thyrotoxicosis with toxic multinodular goiter without thyrotoxic crisis or storm: Secondary | ICD-10-CM | POA: Diagnosis not present

## 2021-06-25 LAB — TSH: TSH: 0.36 u[IU]/mL (ref 0.35–5.50)

## 2021-06-25 LAB — T3, FREE: T3, Free: 3.3 pg/mL (ref 2.3–4.2)

## 2021-06-25 LAB — T4, FREE: Free T4: 0.8 ng/dL (ref 0.60–1.60)

## 2021-06-25 NOTE — Progress Notes (Unsigned)
Patient ID: Alison Bond, female   DOB: 1960/03/07, 61 y.o.   MRN: [id number]          Reason for Appointment: Follow-up for thyroid    History of Present Illness:   The patient's thyroid enlargement was first discovered in 2014 Most likely this was diagnosed on a routine physical exam but details not available Review of outside records and labs indicate that she had a low TSH periodically since 2009 with the lowest level of 0.15 in 2015 Free T3 levels and free T4 levels have been consistently normal However in 2015 her PCP apparently treated her with methimazole for about a year but at that point she does not think she was having any symptoms of palpitations or unusual weight loss and she did not feel subjectively better with this 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 without any need for treatment she was told to follow-up in 1 year   RECENT HISTORY: On her follow-up in 5/21 she was 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 She was complaining of increasing fatigue for the last 2 to 3 years and especially in the prior month Otherwise had no symptoms of palpitations, shakiness, heat intolerance or sweating prior to treatment  Thyroid scan did not show any abnormality and since uptake was normal at 23% I-131 treatment was not done Thyrotropin receptor antibody normal in 08/2019  Since 06/2019 she has been on PTU 50 mg twice daily  She has had normalization of her thyroid levels with this dose and this has been continued unchanged  She says her appetite is reasonably good but she still has lost weight  No recent unusual fatigue, palpitations   Labs from PCP in 5/23 showed low normal TSH of 0.48, free T4 was 0.79  Labs are pending She does take Centrum vitamins but no biotin  Wt Readings from Last 3 Encounters:  06/25/21 106 lb 12.8 oz (48.4 kg)  02/19/21 112 lb 3.2 oz (50.9 kg)  10/22/20 116 lb (52.6 kg)    Lab Results  Component  Value Date   FREET4 0.82 02/19/2021   FREET4 0.84 10/22/2020   FREET4 0.85 05/29/2020   TSH 0.30 (L) 02/19/2021   TSH 0.50 10/22/2020   TSH 0.92 05/29/2020   Lab Results  Component Value Date   T3FREE 3.6 02/19/2021   T3FREE 3.1 10/22/2020   T3FREE 3.2 05/29/2020    She has had an ultrasound exam in  2014 which showed multiple small thyroid nodules and diffuse heterogenous echogenicity, largest nodule 1.1 cm on the left, mixed solid and cystic, well circumscribed Follow-up ultrasound done in 2017 apparently showed no change, this was done by a general surgeon who had been consulted for her goiter.     Allergies as of 06/25/2021       Reactions   Aspirin Other (See Comments)   Patient has ulcers and hurts her stomach and has a tight chest        Medication List        Accurate as of June 25, 2021 10:42 AM. If you have any questions, ask your nurse or doctor.          atenolol 50 MG tablet Commonly known as: TENORMIN TAKE 2 TABLETS BY MOUTH EVERY DAY IN THE MORNING   bimatoprost 0.01 % Soln Commonly known as: LUMIGAN 1 Drop In rt eye. Dr Loni Dolly   brimonidine 0.2 % ophthalmic solution Commonly known as: ALPHAGAN Apply to eye.  CENTRUM ADULTS PO Take 1 tablet by mouth daily.   diclofenac sodium 1 % Gel Commonly known as: VOLTAREN Apply topically 2 (two) times daily.   fluticasone 50 MCG/ACT nasal spray Commonly known as: FLONASE Place into the nose.   mirtazapine 15 MG tablet Commonly known as: REMERON Take 15 mg by mouth at bedtime.   omeprazole 40 MG capsule Commonly known as: PRILOSEC TAKE 1 CAPSULE BY MOUTH EVERY EVENING BEFORE MEAL   oxyCODONE 15 MG immediate release tablet Commonly known as: ROXICODONE Take 15 mg by mouth 4 (four) times daily.   propylthiouracil 50 MG tablet Commonly known as: PTU TAKE 1 TABLET BY MOUTH TWICE A DAY   vitamin B-12 100 MCG tablet Commonly known as: CYANOCOBALAMIN Take one daily.   vitamin C 1000 MG  tablet Take 1,000 mg by mouth daily.   Vitamin D3 1.25 MG (50000 UT) Tabs Take by mouth.   zinc gluconate 50 MG tablet Take 50 mg by mouth daily.        Allergies:  Allergies  Allergen Reactions   Aspirin Other (See Comments)    Patient has ulcers and hurts her stomach and has a tight chest    No past medical history on file.  No past surgical history on file.  Family History  Problem Relation Age of Onset   Diabetes Mother    Cancer Mother    Hypertension Mother    Cancer Paternal Aunt    Thyroid disease Neg Hx     Social History:  reports that she has been smoking. She has never used smokeless tobacco. No history on file for alcohol use and drug use.    Review of Systems    Hypertension: Followed by PCP      Examination:   BP 124/70   Pulse 69   Ht 5\' 3"  (1.6 m)   Wt 106 lb 12.8 oz (48.4 kg)   SpO2 98%   BMI 18.92 kg/m    Thyroid exam: Neck circumference is 36 vs 38 cm previously  Thyroid enlarged nearly 3 times normal on the right and about twice normal on the left Thyroid feels relatively soft/fleshy with only a small firm area on the right medially  No lymphadenopathy in the neck  No tremor  Biceps reflexes appear normal but difficult to elicit  No peripheral edema  Assessment/Plan:  Multinodular goiter, long-standing with largest nodule 1.1 cm, evaluated last in 2017 and previously also in 2014 with stable findings  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  Thyrotropin receptor antibody normal  Previously thyroid scan did not show any hot or warm areas and normal I-131 uptake  No recent complaints of palpitations, unusual weight loss or fatigue  Her thyroid enlargement is appearing to be somewhat less especially on the left side   Recommendations: Thyroid levels to be checked  today  Unless TSH is suppressed will try to stop her PTU and observe without treatment    Elayne Snare 06/25/2021   Addendum: TSH is 0.36, will attempt to stop PTU and follow-up in 3 months

## 2021-06-27 NOTE — Progress Notes (Signed)
Please call to let patient know that the lab results are normal  She will try to stop PTU and follow-up in 3 months on no treatment.  To schedule appointment with same-day labs

## 2021-06-30 ENCOUNTER — Encounter: Payer: Self-pay | Admitting: Endocrinology

## 2021-07-27 ENCOUNTER — Other Ambulatory Visit: Payer: Self-pay | Admitting: Endocrinology

## 2021-09-28 ENCOUNTER — Ambulatory Visit: Payer: Medicare HMO | Admitting: Endocrinology

## 2022-01-25 ENCOUNTER — Ambulatory Visit: Payer: Medicare HMO | Admitting: Endocrinology

## 2022-01-25 ENCOUNTER — Encounter: Payer: Self-pay | Admitting: Endocrinology

## 2022-01-25 VITALS — BP 112/62 | HR 71 | Ht 63.0 in | Wt 96.8 lb

## 2022-01-25 DIAGNOSIS — E042 Nontoxic multinodular goiter: Secondary | ICD-10-CM | POA: Diagnosis not present

## 2022-01-25 LAB — T4, FREE: Free T4: 0.85 ng/dL (ref 0.60–1.60)

## 2022-01-25 LAB — T3, FREE: T3, Free: 3.5 pg/mL (ref 2.3–4.2)

## 2022-01-25 LAB — TSH: TSH: 0.4 u[IU]/mL (ref 0.35–5.50)

## 2022-01-25 NOTE — Progress Notes (Unsigned)
Patient ID: Alison Bond, female   DOB: [birth year], 62 y.o.   MRN: [id number]          Reason for Appointment: Follow-up for thyroid    History of Present Illness:   The patient's thyroid enlargement was first discovered in 2014 Most likely this was diagnosed on a routine physical exam but details not available Review of outside records and labs indicate that she had a low TSH periodically since 2009 with the lowest level of 0.15 in 2015 Free T3 levels and free T4 levels have been consistently normal However in 2015 her PCP apparently treated her with methimazole for about a year but at that point she does not think she was having any symptoms of palpitations or unusual weight loss and she did not feel subjectively better with this 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 without any need for treatment she was told to follow-up in 1 year   RECENT HISTORY: On her follow-up in 5/21 she was 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 She was complaining of increasing fatigue for the last 2 to 3 years and especially in the prior month Otherwise had no symptoms of palpitations, shakiness, heat intolerance or sweating prior to treatment  Thyroid scan did not show any abnormality and since uptake was normal at 23% I-131 treatment was not done Thyrotropin receptor antibody normal in 08/2019  Since 06/2019 she has been on PTU 50 mg twice daily  She had normalization of her thyroid levels with this dose   On her last visit she was told to try stopping her PTU to see if she needed this any further especially since her TSH had improved up to 0.36 compared to 0.3  No recent unusual fatigue, palpitations Her weight has gone down from decreased appetite and chronic pain  Labs from PCP in 5/23 showed low normal TSH of 0.48, free T4 was 0.79  Labs are pending  She does take Centrum vitamins but no biotin  Wt Readings from Last 3 Encounters:  01/25/22 96  lb 12.8 oz (43.9 kg)  06/25/21 106 lb 12.8 oz (48.4 kg)  02/19/21 112 lb 3.2 oz (50.9 kg)    Lab Results  Component Value Date   FREET4 0.80 06/25/2021   FREET4 0.82 02/19/2021   FREET4 0.84 10/22/2020   TSH 0.36 06/25/2021   TSH 0.30 (L) 02/19/2021   TSH 0.50 10/22/2020   Lab Results  Component Value Date   T3FREE 3.3 06/25/2021   T3FREE 3.6 02/19/2021   T3FREE 3.1 10/22/2020    She has had an ultrasound exam in  2014 which showed multiple small thyroid nodules and diffuse heterogenous echogenicity, largest nodule 1.1 cm on the left, mixed solid and cystic, well circumscribed Follow-up ultrasound done in 2017 apparently showed no change, this was done by a general surgeon who had been consulted for her goiter.     Allergies as of 01/25/2022       Reactions   Aspirin Other (See Comments)   Patient has ulcers and hurts her stomach and has a tight chest        Medication List        Accurate as of January 25, 2022 11:52 AM. If you have any questions, ask your nurse or doctor.          Aspirin Low Dose 81 MG tablet Generic drug: aspirin EC Take 81 mg by mouth daily.   atenolol 50 MG tablet Commonly  known as: TENORMIN TAKE 2 TABLETS BY MOUTH EVERY DAY IN THE MORNING   atenolol 50 MG tablet Commonly known as: TENORMIN Take by mouth.   bimatoprost 0.01 % Soln Commonly known as: LUMIGAN 1 Drop In rt eye. Dr Alferd Patee   brimonidine 0.2 % ophthalmic solution Commonly known as: ALPHAGAN Apply to eye.   CENTRUM ADULTS PO Take 1 tablet by mouth daily.   cilostazol 100 MG tablet Commonly known as: PLETAL Take by mouth.   ciprofloxacin 0.3 % ophthalmic solution Commonly known as: CILOXAN 1 drop 4 (four) times daily.   diclofenac sodium 1 % Gel Commonly known as: VOLTAREN Apply topically 2 (two) times daily.   dorzolamide-timolol 2-0.5 % ophthalmic solution Commonly known as: COSOPT 1 drop 2 (two) times daily.   DSS 100 MG Caps Take by mouth.    fluticasone 50 MCG/ACT nasal spray Commonly known as: FLONASE Place into the nose.   ketorolac 0.5 % ophthalmic solution Commonly known as: ACULAR 1 drop 4 (four) times daily.   latanoprost 0.005 % ophthalmic solution Commonly known as: XALATAN SMARTSIG:1 Drop(s) In Eye(s) Every Evening   mirtazapine 15 MG tablet Commonly known as: REMERON Take 15 mg by mouth at bedtime.   omeprazole 40 MG capsule Commonly known as: PRILOSEC TAKE 1 CAPSULE BY MOUTH EVERY EVENING BEFORE MEAL   oxyCODONE 15 MG immediate release tablet Commonly known as: ROXICODONE Take 15 mg by mouth 4 (four) times daily.   propylthiouracil 50 MG tablet Commonly known as: PTU TAKE 1 TABLET BY MOUTH TWICE A DAY   rosuvastatin 10 MG tablet Commonly known as: CRESTOR Take 10 mg by mouth at bedtime.   vitamin B-12 100 MCG tablet Commonly known as: CYANOCOBALAMIN Take one daily.   vitamin C 1000 MG tablet Take 1,000 mg by mouth daily.   Vitamin D3 1.25 MG (50000 UT) Tabs Take by mouth.   zinc gluconate 50 MG tablet Take 50 mg by mouth daily.        Allergies:  Allergies  Allergen Reactions   Aspirin Other (See Comments)    Patient has ulcers and hurts her stomach and has a tight chest    No past medical history on file.  No past surgical history on file.  Family History  Problem Relation Age of Onset   Diabetes Mother    Cancer Mother    Hypertension Mother    Cancer Paternal Aunt    Thyroid disease Neg Hx     Social History:  reports that she has been smoking. She has never used smokeless tobacco. No history on file for alcohol use and drug use.    Review of Systems    Hypertension: Followed by PCP      Examination:   BP 112/62   Pulse 71   Ht 5\' 3"  (1.6 m)   Wt 96 lb 12.8 oz (43.9 kg)   SpO2 99%   BMI 17.15 kg/m    Thyroid exam: Neck circumference is 34 centimeter, 36 previously  Thyroid enlarged about 3 times normal on the right and about twice normal on the  left Texture is generally firm without significant nodules  No lymphadenopathy in the neck  No tremor  Biceps reflexes difficult to elicit  Hands not unusually warm  Assessment/Plan:  Multinodular goiter, long-standing with largest nodule 1.1 cm, evaluated last in 2017 and previously also in 2014 with stable findings  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  Thyrotropin receptor antibody normal  Previously thyroid scan did not show any hot or warm areas and normal I-131 uptake  No recent complaints of palpitations and has no tachycardia  Her thyroid enlargement is unchanged Currently off PTU Clinically no evidence of hyperthyroidism  Recommendations: Thyroid levels to be checked today   Elayne Snare 01/25/2022   Addendum: Thyroid levels are normal, no need to resume PTU and will need periodic follow-up

## 2022-01-26 ENCOUNTER — Encounter: Payer: Self-pay | Admitting: Endocrinology

## 2022-04-19 ENCOUNTER — Encounter: Payer: Self-pay | Admitting: Endocrinology

## 2022-05-26 ENCOUNTER — Ambulatory Visit (INDEPENDENT_AMBULATORY_CARE_PROVIDER_SITE_OTHER): Payer: Medicare HMO | Admitting: Endocrinology

## 2022-05-26 ENCOUNTER — Encounter: Payer: Self-pay | Admitting: Endocrinology

## 2022-05-26 VITALS — BP 118/64 | HR 63 | Resp 16 | Ht 63.0 in | Wt 100.6 lb

## 2022-05-26 DIAGNOSIS — E042 Nontoxic multinodular goiter: Secondary | ICD-10-CM

## 2022-05-26 LAB — T3, FREE: T3, Free: 2.5 pg/mL (ref 2.3–4.2)

## 2022-05-26 LAB — T4, FREE: Free T4: 0.83 ng/dL (ref 0.60–1.60)

## 2022-05-26 LAB — TSH: TSH: 0.23 u[IU]/mL — ABNORMAL LOW (ref 0.35–5.50)

## 2022-05-26 NOTE — Progress Notes (Signed)
Patient ID: Alison Bond, female   DOB: 1960/12/02, 62 y.o.   MRN: [id number]          Reason for Appointment: Follow-up for thyroid    History of Present Illness:   The patient's thyroid enlargement was first discovered in 2014 Most likely this was diagnosed on a routine physical exam but details not available Review of outside records and labs indicate that she had a low TSH periodically since 2009 with the lowest level of 0.15 in 2015 Free T3 levels and free T4 levels have been consistently normal However in 2015 her PCP apparently treated her with methimazole for about a year but at that point she does not think she was having any symptoms of palpitations or unusual weight loss and she did not feel subjectively better with this 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 without any need for treatment she was told to follow-up in 1 year   RECENT HISTORY: On her follow-up in 5/21 she was 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 She was complaining of increasing fatigue for the last 2 to 3 years and especially in the prior month Otherwise had no symptoms of palpitations, shakiness, heat intolerance or sweating prior to treatment  Thyroid scan did not show any abnormality and since uptake was normal at 23% I-131 treatment was not done Thyrotropin receptor antibody normal in 08/2019  Since 06/2019 she had been on PTU 50 mg twice daily  She had normalization of her thyroid levels with this dose   In 06/2021 she was told to stop PTU to see if she needed this any further especially since her TSH had improved up to 0.36 In 1/24 thyroid levels were still normal  No recent symptoms of fatigue, palpitations, she has good weight gain some of the weight she had lost  Labs are pending  She does take Centrum vitamins but no biotin  Wt Readings from Last 3 Encounters:  05/26/22 100 lb 9.6 oz (45.6 kg)  01/25/22 96 lb 12.8 oz (43.9 kg)  06/25/21 106  lb 12.8 oz (48.4 kg)    Lab Results  Component Value Date   FREET4 0.85 01/25/2022   FREET4 0.80 06/25/2021   FREET4 0.82 02/19/2021   TSH 0.40 01/25/2022   TSH 0.36 06/25/2021   TSH 0.30 (L) 02/19/2021   Lab Results  Component Value Date   T3FREE 3.5 01/25/2022   T3FREE 3.3 06/25/2021   T3FREE 3.6 02/19/2021    She has had an ultrasound exam in  2014 which showed multiple small thyroid nodules and diffuse heterogenous echogenicity, largest nodule 1.1 cm on the left, mixed solid and cystic, well circumscribed Follow-up ultrasound done in 2017 apparently showed no change, this was done by a general surgeon who had been consulted for her goiter.  CT scan of her chest in 3/24 also showed a 1.1 cm left-sided nodule  Allergies as of 05/26/2022       Reactions   Aspirin Other (See Comments)   Patient has ulcers and hurts her stomach and has a tight chest        Medication List        Accurate as of May 26, 2022 11:14 AM. If you have any questions, ask your nurse or doctor.          Aspirin Low Dose 81 MG tablet Generic drug: aspirin EC Take 81 mg by mouth daily.   atenolol 50 MG tablet Commonly known as:  TENORMIN TAKE 2 TABLETS BY MOUTH EVERY DAY IN THE MORNING   atenolol 50 MG tablet Commonly known as: TENORMIN Take by mouth.   bimatoprost 0.01 % Soln Commonly known as: LUMIGAN 1 Drop In rt eye. Dr Alferd Patee   brimonidine 0.2 % ophthalmic solution Commonly known as: ALPHAGAN Apply to eye.   CENTRUM ADULTS PO Take 1 tablet by mouth daily.   cilostazol 100 MG tablet Commonly known as: PLETAL Take by mouth.   ciprofloxacin 0.3 % ophthalmic solution Commonly known as: CILOXAN 1 drop 4 (four) times daily.   diclofenac sodium 1 % Gel Commonly known as: VOLTAREN Apply topically 2 (two) times daily.   dorzolamide-timolol 2-0.5 % ophthalmic solution Commonly known as: COSOPT 1 drop 2 (two) times daily.   DSS 100 MG Caps Take by mouth.   fluticasone  50 MCG/ACT nasal spray Commonly known as: FLONASE Place into the nose.   ketorolac 0.5 % ophthalmic solution Commonly known as: ACULAR 1 drop 4 (four) times daily.   latanoprost 0.005 % ophthalmic solution Commonly known as: XALATAN SMARTSIG:1 Drop(s) In Eye(s) Every Evening   mirtazapine 15 MG tablet Commonly known as: REMERON Take 15 mg by mouth at bedtime.   omeprazole 40 MG capsule Commonly known as: PRILOSEC TAKE 1 CAPSULE BY MOUTH EVERY EVENING BEFORE MEAL   oxyCODONE 15 MG immediate release tablet Commonly known as: ROXICODONE Take 15 mg by mouth 4 (four) times daily.   propylthiouracil 50 MG tablet Commonly known as: PTU TAKE 1 TABLET BY MOUTH TWICE A DAY   rosuvastatin 10 MG tablet Commonly known as: CRESTOR Take 10 mg by mouth at bedtime.   vitamin B-12 100 MCG tablet Commonly known as: CYANOCOBALAMIN Take one daily.   vitamin C 1000 MG tablet Take 1,000 mg by mouth daily.   Vitamin D3 1.25 MG (50000 UT) Tabs Take by mouth.   zinc gluconate 50 MG tablet Take 50 mg by mouth daily.        Allergies:  Allergies  Allergen Reactions   Aspirin Other (See Comments)    Patient has ulcers and hurts her stomach and has a tight chest    No past medical history on file.  No past surgical history on file.  Family History  Problem Relation Age of Onset   Diabetes Mother    Cancer Mother    Hypertension Mother    Cancer Paternal Aunt    Thyroid disease Neg Hx     Social History:  reports that she has been smoking. She has never used smokeless tobacco. No history on file for alcohol use and drug use.    Review of Systems    Hypertension: Followed by PCP      Examination:   BP 118/64   Pulse 63   Resp 16   Ht 5\' 3"  (1.6 m)   Wt 100 lb 9.6 oz (45.6 kg)   SpO2 97%   BMI 17.82 kg/m    Thyroid exam: Neck circumference is 34.5 cm compared to 34 centimeter previously  Thyroid enlarged about 3 times normal on the right, relatively firm,  no nodules felt Thyroid enlargement is about twice normal on the left and flexion  No lymphadenopathy in the neck  No tremor  Biceps reflexes did not show any hyperreactivity  Hands not unusually warm  Assessment/Plan:  Multinodular goiter, long-standing with largest nodule 1.1 cm, evaluated last in 2017 and previously also in 2014 with stable findings  She has autonomous function of the thyroid with  low TSH levels for several years  She is being treated for subclinical hyperthyroidism with high free T3 level, started treatment in 6/21.  Thyrotropin receptor antibody normal  Previously thyroid scan did not show any hot or warm areas and normal I-131 uptake She has been off PTU since 06/2021  No recent complaints of palpitations and has no tachycardia  Her thyroid enlargement is unchanged Clinically she looks euthyroid  She has a history of small left-sided thyroid nodules since at least 2014, unchanged on recent CT scan  Recommendations: Thyroid levels to be checked today No further evaluation of thyroid nodule needed  Reather Littler 05/26/2022   Addendum: T3 is normal, TSH only slightly low at 0.23 which is nonsignificant, she can be followed annually now

## 2023-05-30 ENCOUNTER — Ambulatory Visit (INDEPENDENT_AMBULATORY_CARE_PROVIDER_SITE_OTHER): Payer: Medicare HMO | Admitting: Endocrinology

## 2023-05-30 ENCOUNTER — Encounter: Payer: Self-pay | Admitting: Endocrinology

## 2023-05-30 VITALS — BP 116/58 | HR 88 | Resp 16 | Ht 63.0 in | Wt 99.6 lb

## 2023-05-30 DIAGNOSIS — R7989 Other specified abnormal findings of blood chemistry: Secondary | ICD-10-CM | POA: Diagnosis not present

## 2023-05-30 DIAGNOSIS — E042 Nontoxic multinodular goiter: Secondary | ICD-10-CM

## 2023-05-30 NOTE — Progress Notes (Signed)
 Outpatient Endocrinology Note Alison Nadra Hritz, MD   Patient's Name: Alison Bond    DOB: 04-14-1960    MRN: [id number]  REASON OF VISIT: Follow-up for multinodular goiter  PCP: Stacie Dys, MD   HISTORY OF PRESENT ILLNESS:   Alison Bond is a 63 y.o. old female with past medical history as listed below is presented for a follow up for multinodular goiter.  Pertinent Thyroid  History:  Patient was previously seen by Dr. Hubert Madden and was last seen in May 2024.  Patient was first discovered with thyroid  enlargement in 2014.  Patient had periodically low TSH with normal free T4 and free T3 since 2009, TSH was 0.15 in 2015.  She was treated with methimazole around 2015 by primary care provider.  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    In May 2021 patient had elevated free T3 and mildly low TSH with normal free T4, had symptoms of fatigue, no palpitation, heat intolerance or increased sweating.  She had thyroid  nuclear scan in June 2021 did not show any abnormality and uptake was normal 23%, treatment was not done.  Thyroid  receptor antibody was negative in August 2021.   Patient was started on PTU 50 mg twice a day in June 2021.  She had normalized on her thyroid  function test on PTU.  PTU was later restarted in June 2023.  She had normal thyroid  function test of PTU in January 2024.  She has had an ultrasound exam in  2014 which showed multiple small thyroid  nodules and diffuse heterogenous echogenicity, largest nodule 1.1 cm on the left, mixed solid and cystic, well circumscribed. Follow-up ultrasound done in 2017 apparently showed no change, this was done by a general surgeon who had been consulted for her goiter.   CT scan of her chest in 3/24 also showed a 1.1 cm left-sided nodule   Interval history Patient presented for the follow-up, was last time seen by Dr. Hubert Madden in May 2024.  Patient denies dysphagia,  neck discomfort or difficulty breathing.  Patient's family member has noticed enlarged thyroid  however she denies increasing size of the thyroid .  She gets occasional hot flashes and increased sweating, denies palpitation.  She has altered bowel movement based on her meals.  Body weight is relatively stable.  No cold intolerance.  She complains of muscle ache and leg cramps.  Laboratory results from outside facility at PCP office reviewed in Care Everywhere as follows with low TSH and normal free T4 in April.  Care everywhere : outside lab at PCP : TSH 0.365 low with low normal limit of 0.55, Ft4 1.2 normal  10/12/2022 : 0.208, Ft4 1.4   Latest Reference Range & Units 05/26/22 11:27  TSH 0.35 - 5.50 uIU/mL 0.23 (L)  Triiodothyronine,Free,Serum 2.3 - 4.2 pg/mL 2.5  T4,Free(Direct) 0.60 - 1.60 ng/dL 8.84  (L): Data is abnormally low  Patient reports taking calcium and vitamin D supplement however does not take multivitamin or biotin.  REVIEW OF SYSTEMS:  As per history of present illness.   PAST MEDICAL HISTORY: History reviewed. No pertinent past medical history.  PAST SURGICAL HISTORY: History reviewed. No pertinent surgical history.  ALLERGIES: Allergies  Allergen Reactions   Aspirin Other (See Comments)    Patient has ulcers and hurts her stomach and has a tight chest    FAMILY HISTORY:  Family History  Problem Relation Age of Onset   Diabetes Mother    Cancer Mother  Hypertension Mother    Cancer Paternal Aunt    Thyroid  disease Neg Hx     SOCIAL HISTORY: Social History   Socioeconomic History   Marital status: Divorced    Spouse name: Not on file   Number of children: Not on file   Years of education: Not on file   Highest education level: Not on file  Occupational History   Not on file  Tobacco Use   Smoking status: Every Day   Smokeless tobacco: Never  Substance and Sexual Activity   Alcohol use: Not on file   Drug use: Not on file   Sexual activity:  Not on file  Other Topics Concern   Not on file  Social History Narrative   Not on file   Social Drivers of Health   Financial Resource Strain: Not on file  Food Insecurity: Not on file  Transportation Needs: Not on file  Physical Activity: Not on file  Stress: Not on file  Social Connections: Not on file    MEDICATIONS:  Current Outpatient Medications  Medication Sig Dispense Refill   Ascorbic Acid (VITAMIN C) 1000 MG tablet Take 1,000 mg by mouth daily.     ASPIRIN LOW DOSE 81 MG tablet Take 81 mg by mouth daily.     atenolol (TENORMIN) 50 MG tablet TAKE 2 TABLETS BY MOUTH EVERY DAY IN THE MORNING  1   atenolol (TENORMIN) 50 MG tablet Take by mouth.     brimonidine (ALPHAGAN) 0.2 % ophthalmic solution Apply to eye.     Cholecalciferol (VITAMIN D3) 1.25 MG (50000 UT) TABS Take by mouth.     cilostazol (PLETAL) 100 MG tablet Take by mouth.     dorzolamide-timolol (COSOPT) 22.3-6.8 MG/ML ophthalmic solution 1 drop 2 (two) times daily.     fluticasone (FLONASE) 50 MCG/ACT nasal spray Place into the nose.     latanoprost (XALATAN) 0.005 % ophthalmic solution SMARTSIG:1 Drop(s) In Eye(s) Every Evening     mirtazapine (REMERON) 15 MG tablet Take 15 mg by mouth at bedtime.     Multiple Vitamins-Minerals (CENTRUM ADULTS PO) Take 1 tablet by mouth daily.     omeprazole (PRILOSEC) 40 MG capsule Patient takes as needed  4   oxyCODONE (ROXICODONE) 15 MG immediate release tablet Take 10 mg by mouth 4 (four) times daily.  0   rosuvastatin (CRESTOR) 10 MG tablet Take 10 mg by mouth at bedtime.     vitamin B-12 (CYANOCOBALAMIN) 100 MCG tablet Take one daily.     No current facility-administered medications for this visit.    PHYSICAL EXAM: Vitals:   05/30/23 1312  BP: (!) 116/58  Pulse: 88  Resp: 16  SpO2: 99%  Weight: 99 lb 9.6 oz (45.2 kg)  Height: 5\' 3"  (1.6 m)   Body mass index is 17.64 kg/m.  Wt Readings from Last 3 Encounters:  05/30/23 99 lb 9.6 oz (45.2 kg)  05/26/22 100  lb 9.6 oz (45.6 kg)  01/25/22 96 lb 12.8 oz (43.9 kg)     General: Well developed, well nourished female in no apparent distress.  HEENT: AT/[place], no external lesions. Hearing intact to the spoken word Eyes: EOMI. No stare, proptosis. Conjunctiva clear and no icterus. Neck: Trachea midline, neck supple with appreciable thyromegaly 2X or no lymphadenopathy and no palpable thyroid  nodules Lungs: Clear to auscultation, no wheeze. Respirations not labored Heart: S1S2, Regular in rate and rhythm. No loud murmurs Abdomen: Soft, non tender, non distended Neurologic: Alert, oriented, normal speech, deep  tendon biceps reflexes normal,  no gross focal neurological deficit Extremities: No pedal pitting edema, no tremors of outstretched hands Skin: Warm, color good.  Psychiatric: Does not appear depressed or anxious  PERTINENT HISTORIC LABORATORY AND IMAGING STUDIES:  All pertinent laboratory results were reviewed. Please see HPI also for further details.   TSH  Date Value Ref Range Status  05/26/2022 0.23 (L) 0.35 - 5.50 uIU/mL Final  01/25/2022 0.40 0.35 - 5.50 uIU/mL Final  06/25/2021 0.36 0.35 - 5.50 uIU/mL Final     ASSESSMENT / PLAN  1. Low TSH level   2. Multinodular goiter    - Patient is known to have multinodular goiter at least from 2014, largest nodule 1.1 cm evaluated in 2017 stable compared to 2014.  CT scan in March 2024 showed left thyroid  nodule measuring 1.1 cm. 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  She had intermittently low TSH after stopping propylthiouracil .  She had normal thyrotropin receptor antibody in the past in August 2021.  Radioactive iodine nuclear scan in June 2021 was normal did not show hot or cold nodule.  -Recent TSH in outside facility was mildly low with normal free T4.  Patient has occasional complaints of hot flashes and increased  sweating, denies palpitation.  On physical exam not overtly hyperthyroid.  Plan: - I would like to check thyroid  function test today including TSH, free T4 and free T3. - I would also like to check thyroid  autoantibodies for hyperthyroidism including thyrotropin receptor antibody and TSI. - If she continues to have low TSH consistent with subclinical hyperthyroidism will consider antithyroid medication. - No plan for imaging for thyroid  at this time, patient is asked to call our clinic if she develops any new neck symptoms / neck compressive symptoms we will consider ultrasound thyroid .  Diagnoses and all orders for this visit:  Low TSH level -     T4, free -     T3, free -     Thyroid  stimulating immunoglobulin -     TRAb (TSH Receptor Binding Antibody) -     TSH  Multinodular goiter   DISPOSITION Follow up in clinic in 6 months suggested.  All questions answered and patient verbalized understanding of the plan.  Alison Alison Fatima, MD [facility] [address] [place], [address] [place], [place] [address] Phone # [phone]  At least part of this note was generated using voice recognition software. Inadvertent word errors may have occurred, which were not recognized during the proofreading process.

## 2023-06-07 LAB — THYROID STIMULATING IMMUNOGLOBULIN: TSI: <89 %{baseline} (ref <140)

## 2023-06-07 LAB — TRAB (TSH RECEPTOR BINDING ANTIBODY): TRAB: <1 IU/L (ref <=2.00)

## 2023-06-07 LAB — T3, FREE: T3, Free: 3.3 pg/mL (ref 2.3–4.2)

## 2023-06-07 LAB — T4, FREE: Free T4: 1.2 ng/dL (ref 0.8–1.8)

## 2023-06-07 LAB — TSH: TSH: 0.27 m[IU]/L — ABNORMAL LOW (ref 0.40–4.50)

## 2023-06-08 ENCOUNTER — Ambulatory Visit: Payer: Self-pay | Admitting: Endocrinology

## 2023-11-27 ENCOUNTER — Ambulatory Visit: Admitting: Endocrinology

## 2023-12-20 ENCOUNTER — Ambulatory Visit: Admitting: Endocrinology

## 2024-02-14 ENCOUNTER — Ambulatory Visit: Admitting: Endocrinology

## 2024-02-16 ENCOUNTER — Telehealth: Admitting: Endocrinology

## 2024-02-20 NOTE — Progress Notes (Signed)
 Error

## 2024-02-21 ENCOUNTER — Ambulatory Visit: Admitting: Endocrinology

## 2024-02-21 ENCOUNTER — Encounter: Payer: Self-pay | Admitting: Endocrinology

## 2024-02-21 ENCOUNTER — Other Ambulatory Visit

## 2024-02-21 VITALS — BP 130/58 | HR 78 | Ht 63.0 in | Wt 105.2 lb

## 2024-02-21 DIAGNOSIS — E042 Nontoxic multinodular goiter: Secondary | ICD-10-CM

## 2024-02-21 DIAGNOSIS — E0590 Thyrotoxicosis, unspecified without thyrotoxic crisis or storm: Secondary | ICD-10-CM

## 2024-02-21 DIAGNOSIS — R7989 Other specified abnormal findings of blood chemistry: Secondary | ICD-10-CM

## 2024-02-21 LAB — T3, FREE: T3, Free: 3.3 pg/mL (ref 2.3–4.2)

## 2024-02-21 LAB — TSH: TSH: 0.24 m[IU]/L — ABNORMAL LOW (ref 0.40–4.50)

## 2024-02-21 LAB — T4, FREE: Free T4: 1.1 ng/dL (ref 0.8–1.8)

## 2024-02-21 NOTE — Progress Notes (Unsigned)
 "  Outpatient Endocrinology Note Alison Kammerer, MD   Patient's Name: Alison Bond    DOB: 05/14/1960    MRN: [id number]  REASON OF VISIT: Follow-up for multinodular goiter  PCP: Marian Pat, MD   HISTORY OF PRESENT ILLNESS:   Alison Bond is a 64 y.o. old female with past medical history as listed below is presented for a follow up for multinodular goiter / subclinical hyperthyroidism.  Pertinent Thyroid  History:  Patient was previously seen by Dr. Von and was last seen in May 2024.  Patient was first discovered with thyroid  enlargement in 2014.  Patient had periodically low TSH with normal free T4 and free T3 since 2009, TSH was 0.15 in 2015.  She was treated with methimazole  around 2015 by primary care provider.  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    In May 2021 patient had elevated free T3 and mildly low TSH with normal free T4, had symptoms of fatigue, no palpitation, heat intolerance or increased sweating.  She had thyroid  nuclear scan in June 2021 did not show any abnormality and uptake was normal 23%, treatment was not done.  Thyroid  receptor antibody was negative in August 2021.  She had negative TSI and TRAb in May 2025.  Patient was started on PTU 50 mg twice a day in June 2021.  She had normalized on her thyroid  function test on PTU.  PTU was later stopped in June 2023.  She had normal thyroid  function test off PTU in January 2024.  She has had an ultrasound exam in  2014 which showed multiple small thyroid  nodules and diffuse heterogenous echogenicity, largest nodule 1.1 cm on the left, mixed solid and cystic, well circumscribed. Follow-up ultrasound done in 2017 apparently showed no change, this was done by a general surgeon who had been consulted for her goiter.   CT scan of her chest in 3/24 also showed a 1.1 cm left-sided nodule.   Interval history Patient complains of weight loss  slowly and she does not want to lose weight.  She denies palpitation and heat intolerance.  No change in bowel habit.  She has complaints of occasional neck discomfort for several years and denies any change or worsening symptoms.  She denies change in bowel habit.  She had labs in November 17, 2023 with low TSH and normal free T4.  In 11/17/2023: TSH 0.262, Ft4 : 1.1 and 04/2023: 0.346, Ft4: 1.2.  REVIEW OF SYSTEMS:  As per history of present illness.   PAST MEDICAL HISTORY: History reviewed. No pertinent past medical history.  PAST SURGICAL HISTORY: History reviewed. No pertinent surgical history.  ALLERGIES: Allergies  Allergen Reactions   Aspirin Other (See Comments)    Patient has ulcers and hurts her stomach and has a tight chest    FAMILY HISTORY:  Family History  Problem Relation Age of Onset   Diabetes Mother    Cancer Mother    Hypertension Mother    Cancer Paternal Aunt    Thyroid  disease Neg Hx     SOCIAL HISTORY: Social History   Socioeconomic History   Marital status: Divorced    Spouse name: Not on file   Number of children: Not on file   Years of education: Not on file   Highest education level: Not on file  Occupational History   Not on file  Tobacco Use   Smoking status: Every Day   Smokeless tobacco: Never  Substance and  Sexual Activity   Alcohol use: Not on file   Drug use: Not on file   Sexual activity: Not on file  Other Topics Concern   Not on file  Social History Narrative   Not on file   Social Drivers of Health   Tobacco Use: High Risk (02/21/2024)   Patient History    Smoking Tobacco Use: Every Day    Smokeless Tobacco Use: Never    Passive Exposure: Not on file  Financial Resource Strain: Not on file  Food Insecurity: Not on file  Transportation Needs: Not on file  Physical Activity: Not on file  Stress: Not on file  Social Connections: Not on file  Depression (EYV7-0): Not on file  Alcohol Screen: Not on file  Housing: Not  on file  Utilities: Not on file  Health Literacy: Not on file    MEDICATIONS:  Current Outpatient Medications  Medication Sig Dispense Refill   Ascorbic Acid (VITAMIN C) 1000 MG tablet Take 1,000 mg by mouth daily.     ASPIRIN LOW DOSE 81 MG tablet Take 81 mg by mouth daily.     atenolol (TENORMIN) 50 MG tablet Take by mouth.     brimonidine (ALPHAGAN) 0.2 % ophthalmic solution Apply to eye.     Cholecalciferol (VITAMIN D3) 1.25 MG (50000 UT) TABS Take by mouth.     cilostazol (PLETAL) 100 MG tablet Take by mouth.     dorzolamide-timolol (COSOPT) 22.3-6.8 MG/ML ophthalmic solution 1 drop 2 (two) times daily.     fluticasone (FLONASE) 50 MCG/ACT nasal spray Place into the nose.     latanoprost (XALATAN) 0.005 % ophthalmic solution SMARTSIG:1 Drop(s) In Eye(s) Every Evening     mirtazapine (REMERON) 15 MG tablet Take 15 mg by mouth at bedtime.     Multiple Vitamins-Minerals (CENTRUM ADULTS PO) Take 1 tablet by mouth daily.     omeprazole (PRILOSEC) 40 MG capsule Patient takes as needed  4   oxyCODONE (ROXICODONE) 15 MG immediate release tablet Take 10 mg by mouth 4 (four) times daily.  0   rosuvastatin (CRESTOR) 10 MG tablet Take 10 mg by mouth at bedtime.     vitamin B-12 (CYANOCOBALAMIN) 100 MCG tablet Take one daily.     atenolol (TENORMIN) 50 MG tablet TAKE 2 TABLETS BY MOUTH EVERY DAY IN THE MORNING (Patient not taking: Reported on 02/21/2024)  1   No current facility-administered medications for this visit.    PHYSICAL EXAM: Vitals:   02/21/24 1045  BP: (!) 130/58  Pulse: 78  SpO2: 99%  Weight: 105 lb 3.2 oz (47.7 kg)  Height: 5' 3 (1.6 m)   Body mass index is 18.64 kg/m.  Wt Readings from Last 3 Encounters:  02/21/24 105 lb 3.2 oz (47.7 kg)  05/30/23 99 lb 9.6 oz (45.2 kg)  05/26/22 100 lb 9.6 oz (45.6 kg)     General: Well developed, well nourished female in no apparent distress.  HEENT: AT/Alison Bond, no external lesions. Hearing intact to the spoken word Eyes: EOMI. No  stare, proptosis. Conjunctiva clear and no icterus. Neck: Trachea midline, neck supple with appreciable thyromegaly 2X or no lymphadenopathy and no palpable thyroid  nodules Lungs: Clear to auscultation, no wheeze. Respirations not labored Heart: S1S2, Regular in rate and rhythm. No loud murmurs Abdomen: Soft, non tender, non distended Neurologic: Alert, oriented, normal speech, deep tendon biceps reflexes normal,  no gross focal neurological deficit Extremities: No pedal pitting edema, no tremors of outstretched hands Skin: Warm, color good.  Psychiatric: Does not appear depressed or anxious  PERTINENT HISTORIC LABORATORY AND IMAGING STUDIES:  All pertinent laboratory results were reviewed. Please see HPI also for further details.   TSH  Date Value Ref Range Status  05/30/2023 0.27 (L) 0.40 - 4.50 mIU/L Final  05/26/2022 0.23 (L) 0.35 - 5.50 uIU/mL Final  01/25/2022 0.40 0.35 - 5.50 uIU/mL Final     Latest Reference Range & Units 05/30/23 13:38  TSI <140 % baseline <89  TRAB <=2.00 IU/L <1.00     ASSESSMENT / PLAN  1. Subclinical hyperthyroidism   2. Multinodular goiter   3. Low TSH level    - Patient is known to have multinodular goiter at least from 2014, largest nodule 1.1 cm evaluated in 2017 stable compared to 2014.  CT scan in March 2024 showed left thyroid  nodule measuring 1.1 cm. 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  And was treated with methimazole  in the timeframe of 2015.  She had intermittently low TSH after stopping propylthiouracil .  She had normal thyrotropin receptor antibody and TSI in the past in August 2021 and May 2025.  Radioactive iodine nuclear scan in June 2021 was normal did not show hot or cold nodule.  Patient is lately having mild weight loss.  Thyroid  function test in October was low TSH and normal free T4.  Plan: -I would  like to check thyroid  function test TSH, free T4, free T3 today. -She continued to have low TSH we will plan for low-dose of methimazole  likely 2.5 mg daily. - No plan for imaging for thyroid  at this time, patient is asked to call our clinic if she develops any new neck symptoms / neck compressive symptoms we will consider ultrasound thyroid .  Alison Bond was seen today for hypothyroidism.  Diagnoses and all orders for this visit:  Subclinical hyperthyroidism -     T4, free -     T3, free -     TSH  Multinodular goiter -     T4, free -     T3, free -     TSH  Low TSH level    DISPOSITION Follow up in clinic in 4 months suggested.  Labs today and on the same day of the visit.  All questions answered and patient verbalized understanding of the plan.  Alison Sarnowski, MD [facility] Endocr[facility] [address] [place], [address] [place], [place] [address] Phone # [phone]  At least part of this note was generated using voice recognition software. Inadvertent word errors may have occurred, which were not recognized during the proofreading process. "

## 2024-02-22 ENCOUNTER — Ambulatory Visit: Payer: Self-pay | Admitting: Endocrinology

## 2024-02-22 MED ORDER — METHIMAZOLE 5 MG PO TABS: 2.5000 mg | ORAL_TABLET | Freq: Every day | ORAL | 4 refills | Status: AC

## 2024-06-17 ENCOUNTER — Ambulatory Visit: Admitting: Endocrinology
